# Patient Record
Sex: Female | Born: 1995 | Race: Black or African American | Hispanic: No | Marital: Single | Smoking: Never smoker
Health system: Southern US, Community
[De-identification: ages and names within clinical notes are randomized; demographics above are authoritative.]

## PROBLEM LIST (undated history)

## (undated) HISTORY — PX: WISDOM TOOTH EXTRACTION: SHX21

## (undated) HISTORY — PX: BREAST BIOPSY: SHX20

---
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
## 2016-01-04 DIAGNOSIS — R221 Localized swelling, mass and lump, neck: Secondary | ICD-10-CM | POA: Diagnosis not present

## 2016-01-05 DIAGNOSIS — K047 Periapical abscess without sinus: Secondary | ICD-10-CM | POA: Diagnosis not present

## 2016-03-05 DIAGNOSIS — J029 Acute pharyngitis, unspecified: Secondary | ICD-10-CM | POA: Diagnosis not present

## 2016-03-17 DIAGNOSIS — J029 Acute pharyngitis, unspecified: Secondary | ICD-10-CM | POA: Diagnosis not present

## 2016-04-09 DIAGNOSIS — J351 Hypertrophy of tonsils: Secondary | ICD-10-CM | POA: Diagnosis not present

## 2016-04-09 DIAGNOSIS — Z8709 Personal history of other diseases of the respiratory system: Secondary | ICD-10-CM | POA: Insufficient documentation

## 2016-04-09 DIAGNOSIS — J302 Other seasonal allergic rhinitis: Secondary | ICD-10-CM | POA: Insufficient documentation

## 2016-04-30 DIAGNOSIS — Z0000 Encounter for general adult medical examination without abnormal findings: Secondary | ICD-10-CM | POA: Diagnosis not present

## 2016-04-30 DIAGNOSIS — N938 Other specified abnormal uterine and vaginal bleeding: Secondary | ICD-10-CM | POA: Diagnosis not present

## 2016-04-30 DIAGNOSIS — Z23 Encounter for immunization: Secondary | ICD-10-CM | POA: Diagnosis not present

## 2016-06-19 DIAGNOSIS — H5203 Hypermetropia, bilateral: Secondary | ICD-10-CM | POA: Diagnosis not present

## 2016-07-20 DIAGNOSIS — J069 Acute upper respiratory infection, unspecified: Secondary | ICD-10-CM | POA: Diagnosis not present

## 2016-07-20 DIAGNOSIS — B9789 Other viral agents as the cause of diseases classified elsewhere: Secondary | ICD-10-CM | POA: Diagnosis not present

## 2016-09-17 DIAGNOSIS — R002 Palpitations: Secondary | ICD-10-CM | POA: Diagnosis not present

## 2016-09-17 DIAGNOSIS — J069 Acute upper respiratory infection, unspecified: Secondary | ICD-10-CM | POA: Diagnosis not present

## 2016-09-17 DIAGNOSIS — B9789 Other viral agents as the cause of diseases classified elsewhere: Secondary | ICD-10-CM | POA: Diagnosis not present

## 2016-10-01 ENCOUNTER — Other Ambulatory Visit: Payer: Self-pay | Admitting: Family Medicine

## 2016-10-01 DIAGNOSIS — R002 Palpitations: Secondary | ICD-10-CM

## 2016-10-01 DIAGNOSIS — F418 Other specified anxiety disorders: Secondary | ICD-10-CM | POA: Diagnosis not present

## 2016-10-01 DIAGNOSIS — Z6832 Body mass index (BMI) 32.0-32.9, adult: Secondary | ICD-10-CM | POA: Diagnosis not present

## 2016-10-11 ENCOUNTER — Ambulatory Visit (HOSPITAL_COMMUNITY): Payer: 59 | Attending: Cardiovascular Disease

## 2016-10-11 ENCOUNTER — Other Ambulatory Visit: Payer: Self-pay

## 2016-10-11 DIAGNOSIS — I340 Nonrheumatic mitral (valve) insufficiency: Secondary | ICD-10-CM | POA: Diagnosis not present

## 2016-10-11 DIAGNOSIS — R002 Palpitations: Secondary | ICD-10-CM | POA: Diagnosis not present

## 2016-10-16 ENCOUNTER — Encounter (INDEPENDENT_AMBULATORY_CARE_PROVIDER_SITE_OTHER): Payer: Self-pay

## 2016-10-16 ENCOUNTER — Ambulatory Visit (INDEPENDENT_AMBULATORY_CARE_PROVIDER_SITE_OTHER): Payer: 59

## 2016-10-16 DIAGNOSIS — R002 Palpitations: Secondary | ICD-10-CM | POA: Insufficient documentation

## 2016-10-17 ENCOUNTER — Other Ambulatory Visit (HOSPITAL_COMMUNITY): Payer: Self-pay

## 2016-11-30 DIAGNOSIS — J3089 Other allergic rhinitis: Secondary | ICD-10-CM | POA: Diagnosis not present

## 2016-11-30 DIAGNOSIS — R030 Elevated blood-pressure reading, without diagnosis of hypertension: Secondary | ICD-10-CM | POA: Diagnosis not present

## 2016-11-30 DIAGNOSIS — R002 Palpitations: Secondary | ICD-10-CM | POA: Diagnosis not present

## 2016-11-30 DIAGNOSIS — E669 Obesity, unspecified: Secondary | ICD-10-CM | POA: Diagnosis not present

## 2017-02-06 DIAGNOSIS — Z111 Encounter for screening for respiratory tuberculosis: Secondary | ICD-10-CM | POA: Diagnosis not present

## 2017-03-30 DIAGNOSIS — B338 Other specified viral diseases: Secondary | ICD-10-CM | POA: Diagnosis not present

## 2017-05-01 DIAGNOSIS — Z0000 Encounter for general adult medical examination without abnormal findings: Secondary | ICD-10-CM | POA: Diagnosis not present

## 2017-05-01 DIAGNOSIS — L309 Dermatitis, unspecified: Secondary | ICD-10-CM | POA: Diagnosis not present

## 2017-05-01 DIAGNOSIS — Z6833 Body mass index (BMI) 33.0-33.9, adult: Secondary | ICD-10-CM | POA: Diagnosis not present

## 2017-05-01 DIAGNOSIS — J3089 Other allergic rhinitis: Secondary | ICD-10-CM | POA: Diagnosis not present

## 2017-05-01 DIAGNOSIS — Z23 Encounter for immunization: Secondary | ICD-10-CM | POA: Diagnosis not present

## 2017-08-15 DIAGNOSIS — J0100 Acute maxillary sinusitis, unspecified: Secondary | ICD-10-CM | POA: Diagnosis not present

## 2017-08-20 ENCOUNTER — Emergency Department (HOSPITAL_COMMUNITY)
Admission: EM | Admit: 2017-08-20 | Discharge: 2017-08-20 | Disposition: A | Discharge status: Home / Self Care | Payer: 59 | Attending: Emergency Medicine | Admitting: Emergency Medicine

## 2017-08-20 ENCOUNTER — Encounter (HOSPITAL_COMMUNITY): Payer: Self-pay | Admitting: Emergency Medicine

## 2017-08-20 ENCOUNTER — Other Ambulatory Visit: Payer: Self-pay

## 2017-08-20 DIAGNOSIS — J3489 Other specified disorders of nose and nasal sinuses: Secondary | ICD-10-CM | POA: Diagnosis not present

## 2017-08-20 DIAGNOSIS — J039 Acute tonsillitis, unspecified: Secondary | ICD-10-CM | POA: Insufficient documentation

## 2017-08-20 DIAGNOSIS — R638 Other symptoms and signs concerning food and fluid intake: Secondary | ICD-10-CM | POA: Diagnosis not present

## 2017-08-20 DIAGNOSIS — J029 Acute pharyngitis, unspecified: Secondary | ICD-10-CM | POA: Diagnosis not present

## 2017-08-20 LAB — RAPID STREP SCREEN (MED CTR MEBANE ONLY): Streptococcus, Group A Screen (Direct): NEGATIVE

## 2017-08-20 LAB — POC URINE PREG, ED: Preg Test, Ur: NEGATIVE

## 2017-08-20 MED ORDER — PREDNISONE 10 MG PO TABS: 40.0000 mg | ORAL_TABLET | Freq: Every day | ORAL | 0 refills | Status: DC

## 2017-08-20 MED ORDER — METHYLPREDNISOLONE SODIUM SUCC 125 MG IJ SOLR
125.0000 mg | Freq: Once | INTRAMUSCULAR | Status: AC
  Administered 2017-08-20: 125 mg via INTRAVENOUS
  Filled 2017-08-20: qty 2

## 2017-08-20 MED ORDER — NAPROXEN 500 MG PO TABS: 500.0000 mg | ORAL_TABLET | Freq: Two times a day (BID) | ORAL | 0 refills | Status: DC

## 2017-08-20 MED ORDER — KETOROLAC TROMETHAMINE 30 MG/ML IJ SOLN
30.0000 mg | Freq: Once | INTRAMUSCULAR | Status: AC
Start: 2017-08-20 — End: 2017-08-20
  Administered 2017-08-20: 30 mg via INTRAVENOUS
  Filled 2017-08-20: qty 1

## 2017-08-20 MED ORDER — CLINDAMYCIN PHOSPHATE 600 MG/50ML IV SOLN
600.0000 mg | Freq: Once | INTRAVENOUS | Status: AC
  Administered 2017-08-20: 600 mg via INTRAVENOUS
  Filled 2017-08-20: qty 50

## 2017-08-20 MED ORDER — SODIUM CHLORIDE 0.9 % IV BOLUS (SEPSIS)
1000.0000 mL | Freq: Once | INTRAVENOUS | Status: AC
  Administered 2017-08-20: 1000 mL via INTRAVENOUS

## 2017-08-20 MED ORDER — CLINDAMYCIN HCL 150 MG PO CAPS: 450.0000 mg | ORAL_CAPSULE | Freq: Three times a day (TID) | ORAL | 0 refills | Status: AC

## 2017-08-20 NOTE — ED Provider Notes (Signed)
[facility]-EMERGENCY DEPT Provider Note   CSN: [id number] Arrival date & time: 08/20/17  4098     History   Chief Complaint Chief Complaint  Patient presents with  . Facial Pain  . Sore Throat    HPI Martha Pollard is a 22 y.o. female who presents emerged department today for sore throat.  Patient notes that on 08/08/2017 she started developing sinus pressure, pain and rhinorrhea.  She seek medical care with her primary care physician on 08/15/2017 where she was diagnosed with a sinus infection and placed on amoxicillin.  The patient has been taking this medication as prescribed.  She notes on 08/17/2017 she began having sore throat.  She looked in the back of her throat and is noted that in the subsequent days her tonsils have become more swollen with a development of dysphagia yesterday.  She has been able to tolerate fluids at home but notes decrease intake due to the pain of swallowing.  She has been taking OTC medications for her symptoms at home without relief. She denies fever, chills, inability to control secretions, N/V, cough, neck stiffness, ear fullness/drainage, itchy/watery eyes, voice change, dental disease, oral sex, or trauma.   HPI  History reviewed. No pertinent past medical history.  Patient Active Problem List   Diagnosis Date Noted  . Palpitations 10/16/2016    Past Surgical History:  Procedure Laterality Date  . BREAST BIOPSY     right  . WISDOM TOOTH EXTRACTION      OB History    No data available       Home Medications    Prior to Admission medications   Medication Sig Start Date End Date Taking? Authorizing Provider  amoxicillin (AMOXIL) 500 MG capsule Take 500 mg by mouth 2 (two) times daily.   Yes [provider]    Family History History reviewed. No pertinent family history.  Social History Social History   Tobacco Use  . Smoking status: Never Smoker  Substance Use Topics  . Alcohol use: No   Frequency: Never  . Drug use: No     Allergies   Patient has no known allergies.   Review of Systems Review of Systems  All other systems reviewed and are negative.    Physical Exam Updated Vital Signs BP (!) 146/83   Pulse (!) 109   Temp 99.4 F (37.4 C) (Oral)   Resp 20   Wt 81.6 kg (180 lb)   LMP 08/11/2017 (Exact Date) Comment: pt stated that she is not sexually active  SpO2 98%   Physical Exam  Constitutional: She appears well-developed and well-nourished.  HENT:  Head: Normocephalic and atraumatic.  Right Ear: Tympanic membrane and external ear normal.  Left Ear: Tympanic membrane and external ear normal.  Nose: Mucosal edema and rhinorrhea present. Right sinus exhibits maxillary sinus tenderness. Right sinus exhibits no frontal sinus tenderness. Left sinus exhibits maxillary sinus tenderness. Left sinus exhibits no frontal sinus tenderness.  The patient has normal/nasaly phonation and is in control of secretions. No stridor.  Midline uvula without edema. Soft palate rises symmetrically. There is symmetric kissing tonsils bilaterally with erythema and exudates. Close examination without peritonsillar swelling or evidence of PTA. Tongue protrusion is normal. No trismus. No creptius on neck palpation and patient has good dentition. No gingival erythema or fluctuance noted. No floor edema. Mucus membranes slightly dry.   Eyes: Pupils are equal, round, and reactive to light. Right eye exhibits no discharge. Left eye exhibits no discharge.  No scleral icterus.  Neck: Trachea normal. Neck supple. No spinous process tenderness present. No neck rigidity. Normal range of motion present.  No nuchal rigidity or meningismus  Cardiovascular: Normal rate, regular rhythm and intact distal pulses.  No murmur heard. Pulses:      Radial pulses are 2+ on the right side, and 2+ on the left side.       Dorsalis pedis pulses are 2+ on the right side, and 2+ on the left side.       Posterior  tibial pulses are 2+ on the right side, and 2+ on the left side.  No lower extremity swelling or edema. Calves symmetric in size bilaterally.  Pulmonary/Chest: Effort normal and breath sounds normal. She exhibits no tenderness.  No increased work of breathing. No accessory muscle use. Patient is sitting upright, speaking in full sentences without difficulty   Abdominal: Soft. Bowel sounds are normal. There is no tenderness. There is no rebound and no guarding.  Musculoskeletal: She exhibits no edema.  Lymphadenopathy:    She has no cervical adenopathy.  Neurological: She is alert.  Skin: Skin is warm and dry. No rash noted. She is not diaphoretic.  Psychiatric: She has a normal mood and affect.  Nursing note and vitals reviewed.    ED Treatments / Results  Labs (all labs ordered are listed, but only abnormal results are displayed) Labs Reviewed  RAPID STREP SCREEN (NOT AT [facility])  CULTURE, GROUP A STREP ([facility])  POC URINE PREG, ED    EKG  EKG Interpretation None       Radiology No results found.  Procedures Procedures (including critical care time)  Medications Ordered in ED Medications  clindamycin (CLEOCIN) IVPB 600 mg (not administered)  sodium chloride 0.9 % bolus 1,000 mL (not administered)  ketorolac (TORADOL) 30 MG/ML injection 30 mg (not administered)  methylPREDNISolone sodium succinate (SOLU-MEDROL) 125 mg/2 mL injection 125 mg (not administered)     Initial Impression / Assessment and Plan / ED Course  I have reviewed the triage vital signs and the nursing notes.  Pertinent labs & imaging results that were available during my care of the patient were reviewed by me and considered in my medical decision making (see chart for details).     22 year old female presenting to the emergency department today for increasing sore throat over the last 3 days with associated dysphasia and decreased oral intake.  Patient was diagnosed with sinus infection on 2/7 and  prescribed amoxicillin that she has been taking as prescribed (5 days).  On presentation the patient is noted to have tachycardia likely due to decreased oral intake as well as dry mucous membranes.  No fever, hypoxia, tachypnea or hypotension.The patient has normal phonation and is in control of secretions  which makes me believe this is not epiglottitis. No stridor to indicate FB. No trismus. Midline uvula with no peritonsillar swelling. Do not suspect PTA at this time.  No creptius on neck palpation and patient has good dentition which makes ludwigs unlikely. Denies oral sex, do not suspect from G/C. Strep test negative however patient does have significant tonsillar swelling that is symmetric bilaterally (kissing tonsills) with erythema and exudate. Suspect bacterial tonsillitis. The patient appears midlly dehydrated with dry MM and tachycardia. Will give IVF, steroids and change antibotic to clindamycin.  After IV fluids patient's vital signs are improving.  She was able to tolerate greater than 8 ounces of fluid in the department.  She states pain has decreased and is  tolerable.  Will discharge home on steroid burst, clindamycin and NSAIDs for pain. [facility] who I will have her follow up with if symptoms do not improve vs PCP.  Discussion about warning signs for return. Specific return precautions discussed. Time was given for all questions to be answered. The patient verbalized understanding and agreement with plan. The patient appears safe for discharge home.    Final Clinical Impressions(s) / ED Diagnoses   Final diagnoses:  Tonsillitis    ED Discharge Orders        Ordered    clindamycin (CLEOCIN) 150 MG capsule  3 times daily     08/20/17 1450    naproxen (NAPROSYN) 500 MG tablet  2 times daily     08/20/17 1450    predniSONE (DELTASONE) 10 MG tablet  Daily with breakfast     08/20/17 1450       Princella PellegriniMaczis, Authur Cubit M, PA-C 08/20/17 1451    Jacalyn LefevreHaviland, Julie,  MD 08/20/17 1512

## 2017-08-20 NOTE — ED Triage Notes (Addendum)
Pt reports 7 day hx of increasing throat pain,tonsil swelling and sinus pressure.Martha Pollard.Posterior pharynx is red and swollen with multiple white pustules.  Seen by PCP 6 days ago and was started on Amoxicillin. Pt advised to be seen in ED if sx increased. Stated that she has had difficulty swallowing since yesterday. Pt is alert, oriented. Family at bedside.

## 2017-08-20 NOTE — Discharge Instructions (Signed)
Your seen here today for sore throat.  Your strep test was negative however I believe you have bacterial tonsillitis.  I am switching her antibiotic from amoxicillin to clindamycin.  You can discontinue taking the amoxicillin. Please take all of your clindamycin until finished!   You may develop abdominal discomfort or diarrhea from the antibiotic.  You may help offset this with probiotics which you can buy or get in yogurt. Do not eat or take the probiotics until 2 hours after your antibiotic. Do not take your medicine if develop an itchy rash, swelling in your mouth or lips, or difficulty breathing.  I am also prescribing you a steroid burst to help you with the tonsillar swelling.  Take Naproxen otherwise as needed for pain. Please follow up with PCP in the next 2 days for re-evaluation to see if your symptoms are improving. For continued symptoms please follow up with ENT.   Get help right away if: You have any new symptoms such as: Vomiting Very bad headache Stiff neck Chest pain Trouble breathing or swallowing You have very bad throat pain and also have drooling or voice changes. You have very bad pain that is not helped by medicine. You cannot fully open your mouth. You have redness, swelling, or severe pain anywhere in your neck.

## 2017-08-22 DIAGNOSIS — J039 Acute tonsillitis, unspecified: Secondary | ICD-10-CM | POA: Diagnosis not present

## 2017-08-22 LAB — CULTURE, GROUP A STREP (THRC)

## 2017-08-27 DIAGNOSIS — H5213 Myopia, bilateral: Secondary | ICD-10-CM | POA: Diagnosis not present

## 2017-09-20 DIAGNOSIS — J029 Acute pharyngitis, unspecified: Secondary | ICD-10-CM | POA: Diagnosis not present

## 2017-09-20 DIAGNOSIS — J020 Streptococcal pharyngitis: Secondary | ICD-10-CM | POA: Diagnosis not present

## 2017-11-11 DIAGNOSIS — J302 Other seasonal allergic rhinitis: Secondary | ICD-10-CM | POA: Diagnosis not present

## 2017-11-11 DIAGNOSIS — J351 Hypertrophy of tonsils: Secondary | ICD-10-CM | POA: Diagnosis not present

## 2017-11-11 DIAGNOSIS — J0391 Acute recurrent tonsillitis, unspecified: Secondary | ICD-10-CM | POA: Diagnosis not present

## 2018-01-28 DIAGNOSIS — J029 Acute pharyngitis, unspecified: Secondary | ICD-10-CM | POA: Diagnosis not present

## 2018-01-28 DIAGNOSIS — J039 Acute tonsillitis, unspecified: Secondary | ICD-10-CM | POA: Diagnosis not present

## 2018-02-12 DIAGNOSIS — Z111 Encounter for screening for respiratory tuberculosis: Secondary | ICD-10-CM | POA: Diagnosis not present

## 2018-02-28 DIAGNOSIS — R350 Frequency of micturition: Secondary | ICD-10-CM | POA: Diagnosis not present

## 2018-02-28 DIAGNOSIS — R300 Dysuria: Secondary | ICD-10-CM | POA: Diagnosis not present

## 2018-02-28 DIAGNOSIS — N390 Urinary tract infection, site not specified: Secondary | ICD-10-CM | POA: Diagnosis not present

## 2018-04-01 ENCOUNTER — Telehealth: Payer: 59 | Admitting: Nurse Practitioner

## 2018-04-01 DIAGNOSIS — B3731 Acute candidiasis of vulva and vagina: Secondary | ICD-10-CM

## 2018-04-01 DIAGNOSIS — B373 Candidiasis of vulva and vagina: Secondary | ICD-10-CM | POA: Diagnosis not present

## 2018-04-01 MED ORDER — FLUCONAZOLE 150 MG PO TABS: 150.0000 mg | ORAL_TABLET | Freq: Once | ORAL | 0 refills | Status: AC

## 2018-04-01 NOTE — Progress Notes (Signed)

## 2018-04-03 ENCOUNTER — Telehealth: Payer: 59 | Admitting: Family

## 2018-04-03 DIAGNOSIS — B373 Candidiasis of vulva and vagina: Secondary | ICD-10-CM | POA: Diagnosis not present

## 2018-04-03 DIAGNOSIS — H9203 Otalgia, bilateral: Secondary | ICD-10-CM

## 2018-04-03 DIAGNOSIS — J020 Streptococcal pharyngitis: Secondary | ICD-10-CM | POA: Diagnosis not present

## 2018-04-03 DIAGNOSIS — B3731 Acute candidiasis of vulva and vagina: Secondary | ICD-10-CM

## 2018-04-03 MED ORDER — PREDNISONE 5 MG PO TABS: 5.0000 mg | ORAL_TABLET | ORAL | 0 refills | Status: DC

## 2018-04-03 MED ORDER — AMOXICILLIN-POT CLAVULANATE 875-125 MG PO TABS: 1.0000 | ORAL_TABLET | Freq: Two times a day (BID) | ORAL | 0 refills | Status: AC

## 2018-04-03 MED ORDER — FLUCONAZOLE 150 MG PO TABS: 150.0000 mg | ORAL_TABLET | Freq: Once | ORAL | 0 refills | Status: AC

## 2018-04-03 NOTE — Progress Notes (Signed)
Thank you for the details you included in the comment boxes. Those details are very helpful in determining the best course of treatment for you and help [place] to provide the best care. In this particular situation, the photo you uploaded could be a severe bacterial tonsillitis yet could also be strep. Strep varies a lot in presentation from person to person. I am also concerned about your ear pain and that this has all been going on for 7 days. I saw your request not to receive more antibiotics. However, we must use best practices which would be to give the proper dose of antibiotics (the dose you have is actually too low). You asked about the swelling. The proper dose of antibiotics will help that as will a prednisone pack. Additionally, the prednisone pack will help with your ear pain.   Your situation is unique as you have a few things going on. In medicine, we are often guilty of searching for one problem when there are actually 2 or 3 causing symptoms. This appears to be the case in your situation today. That all being said, I am increasing your antibiotics yet also adding more diflucan to prevent a yeast infection. Unfortunately, this is one of the necessary evils for some people who get yeast infections easily. We have to treat this infection as a priority and can try to prevent and treat a yeast infection also.   We are sorry that you are not feeling well.  Here is how we plan to help!  Based on what you have shared with me it is likely that you have sinusitis and also possibly strep pharyngitis.  Strep pharyngitis is inflammation and infection in the back of the throat.  This is an infection cause by bacteria and is treated with antibiotics.  I have prescribed Augmentin 875mg /125mg  one tablet twice daily with food, for 7 days.. I have also sent a steroid pack taper of tablets days 1-6, taking 6,5,4,3,2,1. It will be on the bottle.   For throat pain, we recommend over the counter oral pain relief  medications such as acetaminophen or aspirin, or anti-inflammatory medications such as ibuprofen or naproxen sodium. Topical treatments such as oral throat lozenges or sprays may be used as needed. Strep infections are not as easily transmitted as other respiratory infections, however we still recommend that you avoid close contact with loved ones, especially the very young and elderly.  Remember to wash your hands thoroughly throughout the day as this is the number one way to prevent the spread of infection and wipe down door knobs and counters with disinfectant.   Home Care:  Only take medications as instructed by your medical team.  Complete the entire course of an antibiotic.  Do not take these medications with alcohol.  A steam or ultrasonic humidifier can help congestion.  You can place a towel over your head and breathe in the steam from hot water coming from a faucet.  Avoid close contacts especially the very young and the elderly.  Cover your mouth when you cough or sneeze.  Always remember to wash your hands.  Get Help Right Away If:  You develop worsening fever or sinus pain.  You develop a severe head ache or visual changes.  Your symptoms persist after you have completed your treatment plan.  Make sure you  Understand these instructions.  Will watch your condition.  Will get help right away if you are not doing well or get worse.  Your e-visit answers  were reviewed by a board certified advanced clinical practitioner to complete your personal care plan.  Depending on the condition, your plan could have included both over the counter or prescription medications.  If there is a problem please reply  once you have received a response from your provider.  Your safety is important to [place].  If you have drug allergies check your prescription carefully.    You can use MyChart to ask questions about today's visit, request a non-urgent call back, or ask for a work or school  excuse for 24 hours related to this e-Visit. If it has been greater than 24 hours you will need to follow up with your provider, or enter a new e-Visit to address those concerns.  You will get an e-mail in the next two days asking about your experience.  I hope that your e-visit has been valuable and will speed your recovery. Thank you for using e-visits.

## 2018-04-16 DIAGNOSIS — J0391 Acute recurrent tonsillitis, unspecified: Secondary | ICD-10-CM | POA: Diagnosis not present

## 2018-04-16 DIAGNOSIS — J3089 Other allergic rhinitis: Secondary | ICD-10-CM | POA: Diagnosis not present

## 2018-04-16 DIAGNOSIS — Z23 Encounter for immunization: Secondary | ICD-10-CM | POA: Diagnosis not present

## 2018-04-16 DIAGNOSIS — J309 Allergic rhinitis, unspecified: Secondary | ICD-10-CM | POA: Diagnosis not present

## 2018-04-16 DIAGNOSIS — J301 Allergic rhinitis due to pollen: Secondary | ICD-10-CM | POA: Diagnosis not present

## 2018-06-04 DIAGNOSIS — T7840XA Allergy, unspecified, initial encounter: Secondary | ICD-10-CM | POA: Diagnosis not present

## 2018-06-04 DIAGNOSIS — J0100 Acute maxillary sinusitis, unspecified: Secondary | ICD-10-CM | POA: Diagnosis not present

## 2018-06-17 ENCOUNTER — Telehealth: Payer: 59 | Admitting: Family Medicine

## 2018-06-17 DIAGNOSIS — N390 Urinary tract infection, site not specified: Secondary | ICD-10-CM

## 2018-06-17 MED ORDER — NITROFURANTOIN MONOHYD MACRO 100 MG PO CAPS: 100.0000 mg | ORAL_CAPSULE | Freq: Two times a day (BID) | ORAL | 0 refills | Status: AC

## 2018-06-17 MED FILL — NITROFURANTOIN MONO-MCR 100: 100 | 5 days supply | Qty: 10 | Fill #0

## 2018-06-17 NOTE — Progress Notes (Signed)

## 2018-07-03 ENCOUNTER — Telehealth: Payer: 59 | Admitting: Physician Assistant

## 2018-07-03 DIAGNOSIS — N390 Urinary tract infection, site not specified: Secondary | ICD-10-CM

## 2018-07-03 NOTE — Progress Notes (Signed)
Based on what you shared with me it looks like you have a condition that should be evaluated in a face to face office visit. Giving the recurrence of urinary symptoms so soon after antibiotic treatment, you really need to have urine tested. This can confirm infection but also let us know which bacteria is causing the infection and what antibiotics it may be resistant to. This will help you get the appropriate antibiotic treatment so that this does not keep recurring!   NOTE: If you entered your credit card information for this eVisit, you will not be charged. You may see a "hold" on your card for the $30 but that hold will drop off and you will not have a charge processed.  If you are having a true medical emergency please call 911.  If you need an urgent face to face visit, [place] has four urgent care centers for your convenience.  If you need care fast and have a high deductible or no insurance consider:   [email or web address] to reserve your spot online an avoid wait times  [facility] [address], [address] [place], [place] [address] 8 am to 8 pm Monday-Friday 10 am to 4 pm Saturday-Sunday *Across the street from [employer]  [facility]  [address] [place], [id number] 8 am to 5 pm Monday-Friday * In the [facility] on the [facility]   The following sites will take your  insurance:  . [facility]  [phone] Get Driving Directions Find a Provider at this Location  [address] [place], [place] [address] . 10 am to 8 pm Monday-Friday . 12 pm to 8 pm Saturday-Sunday   . [facility]  [phone] Get Driving Directions Find a Provider at this Location  [address], [address] [place], [place] [address] . 8 am to 8 pm Monday-Friday . 9 am to 6 pm Saturday . 11 am to 6 pm Sunday   . [facility]  [phone] Get  Driving Directions  [address] [place], [place] [address] . 8 am to 8 pm Monday-Friday . 8 am to 4 pm Saturday-Sunday   Your e-visit answers were reviewed by a board certified advanced clinical practitioner to complete your personal care plan.  Thank you for using e-Visits.

## 2018-07-05 ENCOUNTER — Telehealth: Payer: 59 | Admitting: Nurse Practitioner

## 2018-07-05 DIAGNOSIS — N3000 Acute cystitis without hematuria: Secondary | ICD-10-CM | POA: Diagnosis not present

## 2018-07-05 NOTE — Progress Notes (Signed)
Based on what you shared with me it looks like you have a serious condition that should be evaluated in a face to face office visit.  You just did evisit on 12/10 for UTI which would make this recurrent and you need to see PCP so that you can get proper treatment and a culture done.     NOTE: If you entered your credit card information for this eVisit, you will not be charged. You may see a "hold" on your card for the $30 but that hold will drop off and you will not have a charge processed.  If you are having a true medical emergency please call 911.  If you need an urgent face to face visit, [place] has four urgent care centers for your convenience.  If you need care fast and have a high deductible or no insurance consider:   [email or web address] to reserve your spot online an avoid wait times  [facility] [address], [address] [place], [place] [address] 8 am to 8 pm Monday-Friday 10 am to 4 pm Saturday-Sunday *Across the street from [employer]  [facility]  [address] [place], [id number] 8 am to 5 pm Monday-Friday * In the [facility] on the [facility]   The following sites will take your  insurance:  . [facility]  [phone] Get Driving Directions Find a Provider at this Location  [address] [place], [place] [address] . 10 am to 8 pm Monday-Friday . 12 pm to 8 pm Saturday-Sunday   . [facility] at [facility]  [phone] Get Driving Directions Find a Provider at this Location  [address], [address] [place], [place] [address] . 8 am to 8 pm Monday-Friday . 9 am to 6 pm Saturday . 11 am to 6 pm Sunday   . [facility] at [facility]  [phone] Get Driving Directions  [address] [place], [place] [address] . 8 am to 8 pm Monday-Friday . 8 am to 4 pm Saturday-Sunday   Your e-visit answers were reviewed by a board  certified advanced clinical practitioner to complete your personal care plan.  Thank you for using e-Visits.

## 2018-08-04 DIAGNOSIS — Z113 Encounter for screening for infections with a predominantly sexual mode of transmission: Secondary | ICD-10-CM | POA: Diagnosis not present

## 2018-08-04 DIAGNOSIS — R350 Frequency of micturition: Secondary | ICD-10-CM | POA: Diagnosis not present

## 2018-08-04 DIAGNOSIS — Z114 Encounter for screening for human immunodeficiency virus [HIV]: Secondary | ICD-10-CM | POA: Diagnosis not present

## 2018-08-26 MED FILL — TACROLIMUS 0.03% OINTMENT: 0.03 | 15 days supply | Qty: 30 | Fill #0

## 2018-09-22 ENCOUNTER — Other Ambulatory Visit: Payer: Self-pay | Admitting: Family Medicine

## 2018-09-22 ENCOUNTER — Other Ambulatory Visit (HOSPITAL_COMMUNITY)
Admission: RE | Admit: 2018-09-22 | Discharge: 2018-09-22 | Disposition: A | Discharge status: Home / Self Care | Payer: 59 | Source: Ambulatory Visit | Attending: Family Medicine | Admitting: Family Medicine

## 2018-09-22 DIAGNOSIS — Z124 Encounter for screening for malignant neoplasm of cervix: Secondary | ICD-10-CM | POA: Insufficient documentation

## 2018-09-22 DIAGNOSIS — Z0000 Encounter for general adult medical examination without abnormal findings: Secondary | ICD-10-CM | POA: Diagnosis not present

## 2018-09-22 DIAGNOSIS — Z309 Encounter for contraceptive management, unspecified: Secondary | ICD-10-CM | POA: Diagnosis not present

## 2018-09-22 DIAGNOSIS — Z202 Contact with and (suspected) exposure to infections with a predominantly sexual mode of transmission: Secondary | ICD-10-CM | POA: Diagnosis not present

## 2018-09-22 DIAGNOSIS — Z13220 Encounter for screening for lipoid disorders: Secondary | ICD-10-CM | POA: Diagnosis not present

## 2018-09-23 LAB — CYTOLOGY - PAP
Chlamydia: NEGATIVE
Diagnosis: NEGATIVE
Neisseria Gonorrhea: NEGATIVE
Trichomonas: NEGATIVE

## 2018-09-26 DIAGNOSIS — H5203 Hypermetropia, bilateral: Secondary | ICD-10-CM | POA: Diagnosis not present

## 2019-04-13 DIAGNOSIS — Z111 Encounter for screening for respiratory tuberculosis: Secondary | ICD-10-CM | POA: Diagnosis not present

## 2019-04-15 DIAGNOSIS — Z23 Encounter for immunization: Secondary | ICD-10-CM | POA: Diagnosis not present

## 2019-04-15 DIAGNOSIS — Z111 Encounter for screening for respiratory tuberculosis: Secondary | ICD-10-CM | POA: Diagnosis not present

## 2019-05-25 ENCOUNTER — Telehealth: Payer: 59 | Admitting: Physician Assistant

## 2019-05-25 DIAGNOSIS — N760 Acute vaginitis: Secondary | ICD-10-CM

## 2019-05-25 MED ORDER — NYSTATIN 100000 UNIT/GM EX CREA: 1.0000 "application " | TOPICAL_CREAM | Freq: Two times a day (BID) | CUTANEOUS | 0 refills | Status: DC

## 2019-05-25 MED ORDER — FLUCONAZOLE 150 MG PO TABS: 150.0000 mg | ORAL_TABLET | Freq: Once | ORAL | 0 refills | Status: AC

## 2019-05-25 MED FILL — FLUTICASONE PROP 50 MCG SPR: 50 | 30 days supply | Qty: 16 | Fill #0

## 2019-05-25 MED FILL — FLUCONAZOLE 150 MG TABLET: 150 | 1 days supply | Qty: 1 | Fill #0

## 2019-05-25 MED FILL — NYSTATIN 100,000 UNIT/GM CR: 100000 | 30 days supply | Qty: 30 | Fill #0

## 2019-05-25 NOTE — Progress Notes (Signed)
We are sorry that you are not feeling well. Here is how we plan to help! Based on what you shared with me it looks like you: May have a yeast vaginosis  Vaginosis is an inflammation of the vagina that can result in discharge, itching and pain. The cause is usually a change in the normal balance of vaginal bacteria or an infection. Vaginosis can also result from reduced estrogen levels after menopause.  The most common causes of vaginosis are:   Bacterial vaginosis which results from an overgrowth of one on several organisms that are normally present in your vagina.   Yeast infections which are caused by a naturally occurring fungus called candida.   Vaginal atrophy (atrophic vaginosis) which results from the thinning of the vagina from reduced estrogen levels after menopause.   Trichomoniasis which is caused by a parasite and is commonly transmitted by sexual intercourse.  Factors that increase your risk of developing vaginosis include: Marland Kitchen Medications, such as antibiotics and steroids . Uncontrolled diabetes . Use of hygiene products such as bubble bath, vaginal spray or vaginal deodorant . Douching . Wearing damp or tight-fitting clothing . Using an intrauterine device (IUD) for birth control . Hormonal changes, such as those associated with pregnancy, birth control pills or menopause . Sexual activity . Having a sexually transmitted infection  Your treatment plan is A single Diflucan (fluconazole) 150mg  tablet once.  I have electronically sent this prescription into the pharmacy that you have chosen. I have also sent in a prescription for nystatin cream. Use as directed.  These medications should improve your symptoms within the next 24-48 hours. If they do not improve your symptoms then you will need to follow up in person with either your regular doctor or at urgent care.   Be sure to take all of the medication as directed. Stop taking any medication if you develop a rash, tongue  swelling or shortness of breath. Mothers who are breast feeding should consider pumping and discarding their breast milk while on these antibiotics. However, there is no consensus that infant exposure at these doses would be harmful.  Remember that medication creams can weaken latex condoms.   HOME CARE:  Good hygiene may prevent some types of vaginosis from recurring and may relieve some symptoms:  . Avoid baths, hot tubs and whirlpool spas. Rinse soap from your outer genital area after a shower, and dry the area well to prevent irritation. Don't use scented or harsh soaps, such as those with deodorant or antibacterial action. Marland Kitchen Avoid irritants. These include scented tampons and pads. . Wipe from front to back after using the toilet. Doing so avoids spreading fecal bacteria to your vagina.  Other things that may help prevent vaginosis include:  Marland Kitchen Don't douche. Your vagina doesn't require cleansing other than normal bathing. Repetitive douching disrupts the normal organisms that reside in the vagina and can actually increase your risk of vaginal infection. Douching won't clear up a vaginal infection. . Use a latex condom. Both female and female latex condoms may help you avoid infections spread by sexual contact. . Wear cotton underwear. Also wear pantyhose with a cotton crotch. If you feel comfortable without it, skip wearing underwear to bed. Yeast thrives in Marland Kitchen Your symptoms should improve in the next day or two.  GET HELP RIGHT AWAY IF:  . You have pain in your lower abdomen ( pelvic area or over your ovaries) . You develop nausea or vomiting . You develop a fever .  Your discharge changes or worsens . You have persistent pain with intercourse . You develop shortness of breath, a rapid pulse, or you faint.  These symptoms could be signs of problems or infections that need to be evaluated by a medical provider now.  MAKE SURE YOU    Understand these  instructions.  Will watch your condition.  Will get help right away if you are not doing well or get worse.  Your e-visit answers were reviewed by a board certified advanced clinical practitioner to complete your personal care plan. Depending upon the condition, your plan could have included both over the counter or prescription medications. Please review your pharmacy choice to make sure that you have choses a pharmacy that is open for you to pick up any needed prescription, Your safety is important to [place]. If you have drug allergies check your prescription carefully.   You can use MyChart to ask questions about today's visit, request a non-urgent call back, or ask for a work or school excuse for 24 hours related to this e-Visit. If it has been greater than 24 hours you will need to follow up with your provider, or enter a new e-Visit to address those concerns. You will get a MyChart message within the next two days asking about your experience. I hope that your e-visit has been valuable and will speed your recovery.  Approximately 5 minutes was spent documenting and reviewing patient's chart.

## 2019-06-16 MED FILL — SRONYX 0.1-20 MG-MCG TABS: 0.1-20 | 28 days supply | Qty: 28 | Fill #0

## 2019-06-27 ENCOUNTER — Telehealth: Payer: 59 | Admitting: Physician Assistant

## 2019-06-27 DIAGNOSIS — M545 Low back pain, unspecified: Secondary | ICD-10-CM

## 2019-06-27 MED ORDER — NAPROXEN 500 MG PO TABS: 500.0000 mg | ORAL_TABLET | Freq: Two times a day (BID) | ORAL | 0 refills | Status: DC

## 2019-06-27 MED ORDER — CYCLOBENZAPRINE HCL 10 MG PO TABS: 10.0000 mg | ORAL_TABLET | Freq: Three times a day (TID) | ORAL | 0 refills | Status: DC | PRN

## 2019-06-27 NOTE — Progress Notes (Signed)

## 2019-06-27 NOTE — Progress Notes (Signed)
I have spent 5 minutes in review of e-visit questionnaire, review and updating patient chart, medical decision making and response to patient.   Adael Culbreath Cody Irvine Glorioso, PA-C    

## 2019-09-05 ENCOUNTER — Ambulatory Visit: Payer: Medicaid Other | Attending: Internal Medicine

## 2019-09-05 DIAGNOSIS — Z23 Encounter for immunization: Secondary | ICD-10-CM | POA: Insufficient documentation

## 2019-09-05 NOTE — Progress Notes (Signed)
   Covid-19 Vaccination Clinic  Name:  Martha Pollard    MRN: [id number] DOB: [birth year]  09/05/2019  [name] was observed post Covid-19 immunization for 15 minutes without incidence. She was provided with Vaccine Information Sheet and instruction to access the V-Safe system.   [name] was instructed to call 911 with any severe reactions post vaccine: Marland Kitchen Difficulty breathing  . Swelling of your face and throat  . A fast heartbeat  . A bad rash all over your body  . Dizziness and weakness    Immunizations Administered    Name Date Dose VIS Date Route   Moderna COVID-19 Vaccine 09/05/2019  1:44 PM 0.5 mL 06/09/2019 Intramuscular   Manufacturer: Moderna   Lot: [id number]   NDC: 50093-818-29

## 2019-10-03 ENCOUNTER — Ambulatory Visit: Payer: Medicaid Other

## 2019-10-07 ENCOUNTER — Ambulatory Visit: Payer: Medicaid Other

## 2019-10-08 ENCOUNTER — Ambulatory Visit: Payer: Medicaid Other | Attending: Family

## 2019-10-08 DIAGNOSIS — Z23 Encounter for immunization: Secondary | ICD-10-CM

## 2019-10-08 NOTE — Progress Notes (Signed)
   Covid-19 Vaccination Clinic  Name:  Martha Pollard    MRN: [id number] DOB: 26-Mar-1996  10/08/2019  [name] was observed post Covid-19 immunization for 15 minutes without incident. She was provided with Vaccine Information Sheet and instruction to access the V-Safe system.   [name] was instructed to call 911 with any severe reactions post vaccine: Marland Kitchen Difficulty breathing  . Swelling of face and throat  . A fast heartbeat  . A bad rash all over body  . Dizziness and weakness   Immunizations Administered    Name Date Dose VIS Date Route   Moderna COVID-19 Vaccine 10/08/2019 12:49 PM 0.5 mL 06/09/2019 Intramuscular   Manufacturer: Moderna   Lot: [id number]   NDC: 48185-909-31

## 2019-12-01 ENCOUNTER — Telehealth: Payer: Medicaid Other | Admitting: Physician Assistant

## 2019-12-01 DIAGNOSIS — R059 Cough, unspecified: Secondary | ICD-10-CM

## 2019-12-01 DIAGNOSIS — J019 Acute sinusitis, unspecified: Secondary | ICD-10-CM

## 2019-12-01 DIAGNOSIS — R05 Cough: Secondary | ICD-10-CM

## 2019-12-01 MED ORDER — PREDNISONE 20 MG PO TABS: ORAL_TABLET | ORAL | 0 refills | Status: DC

## 2019-12-01 MED ORDER — AZELASTINE HCL 0.1 % NA SOLN: 2.0000 | Freq: Two times a day (BID) | NASAL | 0 refills | Status: DC

## 2019-12-01 NOTE — Progress Notes (Signed)
E visit for Allergic Rhinitis We are sorry that you are not feeling well.  Here is how we plan to help!  Based on what you have shared with me it looks like you have Allergic Rhinitis.  Rhinitis is when a reaction occurs that causes nasal congestion, cough, runny nose, sneezing, and itching.  Most types of rhinitis are caused by an inflammation and are associated with symptoms in the eyes, ears or throat.  There are several types of rhinitis.  The most common are acute rhinitis, which is usually caused by a viral illness, allergic or seasonal rhinitis, and nonallergic or year-round rhinitis.  Nasal allergies occur certain times of the year.  Allergic rhinitis is caused when allergens in the air trigger the release of histamine in the body.  Histamine causes itching, swelling, and fluid to build up in the fragile linings of the nasal passages, sinuses and eyelids.  An itchy nose and clear discharge are common.  I see you have been tested for allergies in the past.  Please be sure to follow-up with your PCP soon to discuss gaining better control of your symptoms.   I have prescribed: Prednisone. Take this as directed. Take with food.    I have also prescribed Azelastine 1 or 2 sprays per nostril once or twice daily for your nasal congestion.   I recommend the following over the counter treatments: Xyzal 5 mg take 1 tablet daily  I also would recommend a nasal spray: Saline 1 spray into each nostril as needed  You may also benefit from eye drops such as: Systane 1-2 driops each eye twice daily as needed  HOME CARE:   You can use an over-the-counter saline nasal spray as needed  Avoid areas where there is heavy dust, mites, or molds  Stay indoors on windy days during the pollen season  Keep windows closed in home, at least in bedroom; use air conditioner.  Use high-efficiency house air filter  Keep windows closed in car, turn AC on re-circulate  Avoid playing out with dog during  pollen season  GET HELP RIGHT AWAY IF:   If your symptoms do not improve within 10 days  You become short of breath  You develop yellow or green discharge from your nose for over 3 days  You have coughing fits  MAKE SURE YOU:   Understand these instructions  Will watch your condition  Will get help right away if you are not doing well or get worse  Thank you for choosing an e-visit. Your e-visit answers were reviewed by a board certified advanced clinical practitioner to complete your personal care plan. Depending upon the condition, your plan could have included both over the counter or prescription medications. Please review your pharmacy choice. Be sure that the pharmacy you have chosen is open so that you can pick up your prescription now.  If there is a problem you may message your provider in [place] to have the prescription routed to another pharmacy. Your safety is important to [place]. If you have drug allergies check your prescription carefully.  For the next 24 hours, you can use MyChart to ask questions about today's visit, request a non-urgent call back, or ask for a work or school excuse from your e-visit provider. You will get an email in the next two days asking about your experience. I hope that your e-visit has been valuable and will speed your recovery.        Greater than 5 minutes, yet  less than 10 minutes of time have been spent researching, coordinating and implementing care for this patient today.

## 2020-01-02 ENCOUNTER — Telehealth: Payer: Medicaid Other | Admitting: Nurse Practitioner

## 2020-01-02 DIAGNOSIS — N390 Urinary tract infection, site not specified: Secondary | ICD-10-CM

## 2020-01-02 MED ORDER — NITROFURANTOIN MONOHYD MACRO 100 MG PO CAPS: 100.0000 mg | ORAL_CAPSULE | Freq: Two times a day (BID) | ORAL | 0 refills | Status: DC

## 2020-01-02 NOTE — Progress Notes (Signed)

## 2020-01-29 ENCOUNTER — Telehealth: Payer: Medicaid Other | Admitting: Emergency Medicine

## 2020-01-29 DIAGNOSIS — T7840XA Allergy, unspecified, initial encounter: Secondary | ICD-10-CM

## 2020-01-29 MED ORDER — PREDNISONE 20 MG PO TABS: 20.0000 mg | ORAL_TABLET | Freq: Every day | ORAL | 0 refills | Status: AC

## 2020-01-29 NOTE — Progress Notes (Signed)
E visit for Allergic Rhinitis We are sorry that you are not feeling well.  Here is how we plan to help!  Based on what you have shared with me it looks like you have Allergic Rhinitis.  Rhinitis is when a reaction occurs that causes nasal congestion, runny nose, sneezing, and itching.  Most types of rhinitis are caused by an inflammation and are associated with symptoms in the eyes ears or throat. There are several types of rhinitis.  The most common are acute rhinitis, which is usually caused by a viral illness, allergic or seasonal rhinitis, and nonallergic or year-round rhinitis.  Nasal allergies occur certain times of the year.  Allergic rhinitis is caused when allergens in the air trigger the release of histamine in the body.  Histamine causes itching, swelling, and fluid to build up in the fragile linings of the nasal passages, sinuses and eyelids.  An itchy nose and clear discharge are common.  I recommend the following over the counter treatments: Xyzal 5 mg take 1 tablet daily  I have prescribed: Prednisone 20 mg take 1 tablet daily for 4 days   HOME CARE:   You can use an over-the-counter saline nasal spray as needed  Avoid areas where there is heavy dust, mites, or molds  Stay indoors on windy days during the pollen season  Keep windows closed in home, at least in bedroom; use air conditioner.  Use high-efficiency house air filter  Keep windows closed in car, turn AC on re-circulate  Avoid playing out with dog during pollen season  GET HELP RIGHT AWAY IF:   If your symptoms do not improve within 10 days  You become short of breath  You develop yellow or green discharge from your nose for over 3 days  You have coughing fits  MAKE SURE YOU:   Understand these instructions  Will watch your condition  Will get help right away if you are not doing well or get worse  Thank you for choosing an e-visit. Your e-visit answers were reviewed by a board certified  advanced clinical practitioner to complete your personal care plan. Depending upon the condition, your plan could have included both over the counter or prescription medications. Please review your pharmacy choice. Be sure that the pharmacy you have chosen is open so that you can pick up your prescription now.  If there is a problem you may message your provider in MyChart to have the prescription routed to another pharmacy. Your safety is important to [place]. If you have drug allergies check your prescription carefully.  For the next 24 hours, you can use MyChart to ask questions about today's visit, request a non-urgent call back, or ask for a work or school excuse from your e-visit provider. You will get an email in the next two days asking about your experience. I hope that your e-visit has been valuable and will speed your recovery.   I spent 5-10 minutes reviewing patient's chart.

## 2020-02-17 ENCOUNTER — Other Ambulatory Visit: Payer: Self-pay | Admitting: Otolaryngology

## 2020-03-05 ENCOUNTER — Telehealth: Payer: Medicaid Other | Admitting: Nurse Practitioner

## 2020-03-05 DIAGNOSIS — B3731 Acute candidiasis of vulva and vagina: Secondary | ICD-10-CM

## 2020-03-05 DIAGNOSIS — B373 Candidiasis of vulva and vagina: Secondary | ICD-10-CM

## 2020-03-05 MED ORDER — FLUCONAZOLE 150 MG PO TABS: 150.0000 mg | ORAL_TABLET | Freq: Once | ORAL | 0 refills | Status: AC

## 2020-03-05 NOTE — Progress Notes (Signed)

## 2020-06-20 ENCOUNTER — Encounter (HOSPITAL_BASED_OUTPATIENT_CLINIC_OR_DEPARTMENT_OTHER): Payer: Self-pay | Admitting: Otolaryngology

## 2020-06-20 ENCOUNTER — Other Ambulatory Visit: Payer: Self-pay

## 2020-06-23 ENCOUNTER — Other Ambulatory Visit (HOSPITAL_COMMUNITY): Payer: Medicaid Other

## 2020-06-24 ENCOUNTER — Other Ambulatory Visit (HOSPITAL_COMMUNITY)
Admission: RE | Admit: 2020-06-24 | Discharge: 2020-06-24 | Disposition: A | Discharge status: Home / Self Care | Payer: BC Managed Care – PPO | Source: Ambulatory Visit | Attending: Otolaryngology | Admitting: Otolaryngology

## 2020-06-24 DIAGNOSIS — J353 Hypertrophy of tonsils with hypertrophy of adenoids: Secondary | ICD-10-CM | POA: Diagnosis present

## 2020-06-24 DIAGNOSIS — Z01812 Encounter for preprocedural laboratory examination: Secondary | ICD-10-CM | POA: Insufficient documentation

## 2020-06-24 DIAGNOSIS — J3501 Chronic tonsillitis: Secondary | ICD-10-CM | POA: Diagnosis not present

## 2020-06-24 DIAGNOSIS — Z20822 Contact with and (suspected) exposure to covid-19: Secondary | ICD-10-CM | POA: Insufficient documentation

## 2020-06-24 DIAGNOSIS — R196 Halitosis: Secondary | ICD-10-CM | POA: Diagnosis not present

## 2020-06-24 LAB — SARS CORONAVIRUS 2 (TAT 6-24 HRS): SARS Coronavirus 2: NEGATIVE

## 2020-06-27 ENCOUNTER — Other Ambulatory Visit: Payer: Self-pay

## 2020-06-27 ENCOUNTER — Encounter (HOSPITAL_BASED_OUTPATIENT_CLINIC_OR_DEPARTMENT_OTHER)
Admission: RE | Disposition: A | Discharge status: Home / Self Care | Payer: Self-pay | Source: Home / Self Care | Attending: Otolaryngology

## 2020-06-27 ENCOUNTER — Ambulatory Visit (HOSPITAL_BASED_OUTPATIENT_CLINIC_OR_DEPARTMENT_OTHER): Payer: BC Managed Care – PPO | Admitting: Anesthesiology

## 2020-06-27 ENCOUNTER — Encounter (HOSPITAL_BASED_OUTPATIENT_CLINIC_OR_DEPARTMENT_OTHER): Payer: Self-pay | Admitting: Otolaryngology

## 2020-06-27 ENCOUNTER — Ambulatory Visit (HOSPITAL_BASED_OUTPATIENT_CLINIC_OR_DEPARTMENT_OTHER)
Admission: RE | Admit: 2020-06-27 | Discharge: 2020-06-27 | Disposition: A | Discharge status: Home / Self Care | Payer: BC Managed Care – PPO | Attending: Otolaryngology | Admitting: Otolaryngology

## 2020-06-27 DIAGNOSIS — J353 Hypertrophy of tonsils with hypertrophy of adenoids: Secondary | ICD-10-CM | POA: Diagnosis not present

## 2020-06-27 DIAGNOSIS — Z20822 Contact with and (suspected) exposure to covid-19: Secondary | ICD-10-CM | POA: Insufficient documentation

## 2020-06-27 DIAGNOSIS — J3501 Chronic tonsillitis: Secondary | ICD-10-CM | POA: Insufficient documentation

## 2020-06-27 DIAGNOSIS — R196 Halitosis: Secondary | ICD-10-CM | POA: Insufficient documentation

## 2020-06-27 HISTORY — PX: TONSILLECTOMY AND ADENOIDECTOMY: SHX28

## 2020-06-27 SURGERY — TONSILLECTOMY AND ADENOIDECTOMY
Anesthesia: General | Site: Throat | Laterality: Bilateral

## 2020-06-27 MED ORDER — FENTANYL CITRATE (PF) 100 MCG/2ML IJ SOLN
INTRAMUSCULAR | Status: AC
  Filled 2020-06-27: qty 2

## 2020-06-27 MED ORDER — AMOXICILLIN 400 MG/5ML PO SUSR: 800.0000 mg | Freq: Two times a day (BID) | ORAL | 0 refills | Status: DC

## 2020-06-27 MED ORDER — OXYCODONE HCL 5 MG PO TABS: 5.0000 mg | ORAL_TABLET | Freq: Once | ORAL | Status: DC | PRN

## 2020-06-27 MED ORDER — OXYMETAZOLINE HCL 0.05 % NA SOLN
NASAL | Status: DC | PRN
  Administered 2020-06-27: 1 via TOPICAL

## 2020-06-27 MED ORDER — LIDOCAINE HCL (CARDIAC) PF 100 MG/5ML IV SOSY
PREFILLED_SYRINGE | INTRAVENOUS | Status: DC | PRN
  Administered 2020-06-27: 100 mg via INTRAVENOUS

## 2020-06-27 MED ORDER — MIDAZOLAM HCL 2 MG/2ML IJ SOLN
INTRAMUSCULAR | Status: AC
  Filled 2020-06-27: qty 2

## 2020-06-27 MED ORDER — LACTATED RINGERS IV SOLN: INTRAVENOUS | Status: DC

## 2020-06-27 MED ORDER — OXYCODONE HCL 5 MG/5ML PO SOLN: 5.0000 mg | Freq: Once | ORAL | Status: DC | PRN

## 2020-06-27 MED ORDER — HYDROCODONE-ACETAMINOPHEN 7.5-325 MG/15ML PO SOLN: 15.0000 mL | ORAL | 0 refills | Status: AC | PRN

## 2020-06-27 MED ORDER — SODIUM CHLORIDE 0.9 % IR SOLN
Status: DC | PRN
  Administered 2020-06-27: 200 mL

## 2020-06-27 MED ORDER — ROCURONIUM BROMIDE 100 MG/10ML IV SOLN
INTRAVENOUS | Status: DC | PRN
  Administered 2020-06-27: 10 mg via INTRAVENOUS

## 2020-06-27 MED ORDER — PROPOFOL 10 MG/ML IV BOLUS
INTRAVENOUS | Status: DC | PRN
  Administered 2020-06-27: 170 mg via INTRAVENOUS

## 2020-06-27 MED ORDER — FENTANYL CITRATE (PF) 100 MCG/2ML IJ SOLN
INTRAMUSCULAR | Status: DC | PRN
  Administered 2020-06-27: 100 ug via INTRAVENOUS

## 2020-06-27 MED ORDER — PROPOFOL 10 MG/ML IV BOLUS
INTRAVENOUS | Status: AC
  Filled 2020-06-27: qty 20

## 2020-06-27 MED ORDER — SCOPOLAMINE 1 MG/3DAYS TD PT72
MEDICATED_PATCH | TRANSDERMAL | Status: AC
  Filled 2020-06-27: qty 1

## 2020-06-27 MED ORDER — HYDROCODONE-ACETAMINOPHEN 7.5-325 MG/15ML PO SOLN: 15.0000 mL | ORAL | 0 refills | Status: DC | PRN

## 2020-06-27 MED ORDER — SCOPOLAMINE 1 MG/3DAYS TD PT72
1.0000 | MEDICATED_PATCH | TRANSDERMAL | Status: DC
  Administered 2020-06-27: 1.5 mg via TRANSDERMAL

## 2020-06-27 MED ORDER — DEXAMETHASONE SODIUM PHOSPHATE 4 MG/ML IJ SOLN
INTRAMUSCULAR | Status: DC | PRN
  Administered 2020-06-27: 10 mg via INTRAVENOUS

## 2020-06-27 MED ORDER — ONDANSETRON HCL 4 MG/2ML IJ SOLN
INTRAMUSCULAR | Status: DC | PRN
  Administered 2020-06-27: 4 mg via INTRAVENOUS

## 2020-06-27 MED ORDER — DEXAMETHASONE SODIUM PHOSPHATE 10 MG/ML IJ SOLN
INTRAMUSCULAR | Status: AC
  Filled 2020-06-27: qty 1

## 2020-06-27 MED ORDER — HYDROMORPHONE HCL 1 MG/ML IJ SOLN: 0.2500 mg | INTRAMUSCULAR | Status: DC | PRN

## 2020-06-27 MED ORDER — DEXMEDETOMIDINE HCL 200 MCG/2ML IV SOLN
INTRAVENOUS | Status: DC | PRN
  Administered 2020-06-27: 12 ug via INTRAVENOUS

## 2020-06-27 MED ORDER — ONDANSETRON HCL 4 MG/2ML IJ SOLN
INTRAMUSCULAR | Status: AC
  Filled 2020-06-27: qty 2

## 2020-06-27 MED ORDER — AMOXICILLIN 400 MG/5ML PO SUSR: 800.0000 mg | Freq: Two times a day (BID) | ORAL | 0 refills | Status: AC

## 2020-06-27 MED ORDER — LIDOCAINE 2% (20 MG/ML) 5 ML SYRINGE
INTRAMUSCULAR | Status: AC
  Filled 2020-06-27: qty 5

## 2020-06-27 MED ORDER — MEPERIDINE HCL 25 MG/ML IJ SOLN: 6.2500 mg | INTRAMUSCULAR | Status: DC | PRN

## 2020-06-27 MED ORDER — MIDAZOLAM HCL 5 MG/5ML IJ SOLN
INTRAMUSCULAR | Status: DC | PRN
  Administered 2020-06-27: 2 mg via INTRAVENOUS

## 2020-06-27 MED ORDER — SUCCINYLCHOLINE CHLORIDE 20 MG/ML IJ SOLN
INTRAMUSCULAR | Status: DC | PRN
  Administered 2020-06-27: 120 mg via INTRAVENOUS

## 2020-06-27 MED ORDER — PROMETHAZINE HCL 25 MG/ML IJ SOLN: 6.2500 mg | INTRAMUSCULAR | Status: DC | PRN

## 2020-06-27 MED ORDER — AMISULPRIDE (ANTIEMETIC) 5 MG/2ML IV SOLN: 10.0000 mg | Freq: Once | INTRAVENOUS | Status: DC | PRN

## 2020-06-27 SURGICAL SUPPLY — 32 items
BNDG COHESIVE 2X5 TAN STRL LF (GAUZE/BANDAGES/DRESSINGS) IMPLANT
CANISTER SUCT 1200ML W/VALVE (MISCELLANEOUS) ×2 IMPLANT
CATH ROBINSON RED A/P 10FR (CATHETERS) IMPLANT
CATH ROBINSON RED A/P 14FR (CATHETERS) ×2 IMPLANT
COAGULATOR SUCT SWTCH 10FR 6 (ELECTROSURGICAL) IMPLANT
COVER BACK TABLE 60X90IN (DRAPES) ×2 IMPLANT
COVER MAYO STAND STRL (DRAPES) ×2 IMPLANT
COVER WAND RF STERILE (DRAPES) IMPLANT
ELECT REM PT RETURN 9FT ADLT (ELECTROSURGICAL) ×2
ELECT REM PT RETURN 9FT PED (ELECTROSURGICAL)
ELECTRODE REM PT RETRN 9FT PED (ELECTROSURGICAL) IMPLANT
ELECTRODE REM PT RTRN 9FT ADLT (ELECTROSURGICAL) ×1 IMPLANT
GAUZE SPONGE 4X4 12PLY STRL LF (GAUZE/BANDAGES/DRESSINGS) ×2 IMPLANT
GLOVE BIO SURGEON STRL SZ7 (GLOVE) ×2 IMPLANT
GLOVE BIO SURGEON STRL SZ7.5 (GLOVE) ×2 IMPLANT
GLOVE BIOGEL PI IND STRL 7.5 (GLOVE) ×1 IMPLANT
GLOVE BIOGEL PI INDICATOR 7.5 (GLOVE) ×1
GOWN STRL REUS W/ TWL LRG LVL3 (GOWN DISPOSABLE) ×2 IMPLANT
GOWN STRL REUS W/TWL LRG LVL3 (GOWN DISPOSABLE) ×4
IV NS 500ML (IV SOLUTION) ×2
IV NS 500ML BAXH (IV SOLUTION) ×1 IMPLANT
MARKER SKIN DUAL TIP RULER LAB (MISCELLANEOUS) IMPLANT
NS IRRIG 1000ML POUR BTL (IV SOLUTION) ×2 IMPLANT
SHEET MEDIUM DRAPE 40X70 STRL (DRAPES) ×2 IMPLANT
SOLUTION BUTLER CLEAR DIP (MISCELLANEOUS) ×2 IMPLANT
SPONGE TONSIL TAPE 1.25 RFD (DISPOSABLE) ×2 IMPLANT
SYR BULB EAR ULCER 3OZ GRN STR (SYRINGE) IMPLANT
TOWEL GREEN STERILE FF (TOWEL DISPOSABLE) ×2 IMPLANT
TUBE CONNECTING 20X1/4 (TUBING) ×2 IMPLANT
TUBE SALEM SUMP 12R W/ARV (TUBING) IMPLANT
TUBE SALEM SUMP 16 FR W/ARV (TUBING) ×2 IMPLANT
WAND COBLATOR 70 EVAC XTRA (SURGICAL WAND) ×2 IMPLANT

## 2020-06-27 NOTE — Anesthesia Postprocedure Evaluation (Signed)
Anesthesia Post Note  Patient: Martha Pollard  Procedure(s) Performed: TONSILLECTOMY AND ADENOIDECTOMY (Bilateral Throat)     Patient location during evaluation: PACU Anesthesia Type: General Level of consciousness: awake and alert Pain management: pain level controlled Vital Signs Assessment: post-procedure vital signs reviewed and stable Respiratory status: spontaneous breathing, nonlabored ventilation and respiratory function stable Cardiovascular status: blood pressure returned to baseline and stable Postop Assessment: no apparent nausea or vomiting Anesthetic complications: no   No complications documented.  Last Vitals:  Vitals:   06/27/20 0957 06/27/20 1015  BP: (!) 106/59 118/78  Pulse: (!) 110 (!) 105  Resp: 19 16  Temp:  37.2 C  SpO2: 100% 100%    Last Pain:  Vitals:   06/27/20 1015  TempSrc:   PainSc: 0-No pain                 Lowella Curb

## 2020-06-27 NOTE — Op Note (Signed)
DATE OF PROCEDURE:  06/27/2020                              OPERATIVE REPORT  SURGEON:  Newman Pies, MD  PREOPERATIVE DIAGNOSES: 1. Adenotonsillar hypertrophy. 2. Chronic tonsillitis and pharyngitis  POSTOPERATIVE DIAGNOSES: 1. Adenotonsillar hypertrophy. 2. Chronic tonsillitis and pharyngitis  PROCEDURE PERFORMED:  Adenotonsillectomy.  ANESTHESIA:  General endotracheal tube anesthesia.  COMPLICATIONS:  None.  ESTIMATED BLOOD LOSS:  Minimal.  INDICATION FOR PROCEDURE:  Martha Pollard is a 24 y.o. female with a history of chronic tonsillitis/pharyngitis and halitosis.  According to the patient, she has been experiencing chronic throat discomfort with halitosis for several years. The patient continued to be symptomatic despite medical treatments. On examination, the patient was noted to have bilateral cryptic tonsils, with numerous tonsilloliths. Based on the above findings, the decision was made for the patient to undergo the adenotonsillectomy procedure. Likelihood of success in reducing symptoms was also discussed.  The risks, benefits, alternatives, and details of the procedure were discussed with the patient.  Questions were invited and answered.  Informed consent was obtained.  DESCRIPTION:  The patient was taken to the operating room and placed supine on the operating table.  General endotracheal tube anesthesia was administered by the anesthesiologist.  The patient was positioned and prepped and draped in a standard fashion for adenotonsillectomy.  A Crowe-Davis mouth gag was inserted into the oral cavity for exposure. 3+ cryptic tonsils were noted bilaterally.  No bifidity was noted.  Indirect mirror examination of the nasopharynx revealed moderate adenoid hypertrophy. The adenoid was ablated with the Coblator device. Hemostasis was achieved with the Coblator device.  The right tonsil was then grasped with a straight Allis clamp and retracted medially.  It was resected free from the  underlying pharyngeal constrictor muscles with the Coblator device.  The same procedure was repeated on the left side without exception.  The surgical sites were copiously irrigated.  The mouth gag was removed.  The care of the patient was turned over to the anesthesiologist.  The patient was awakened from anesthesia without difficulty.  The patient was extubated and transferred to the recovery room in good condition.  OPERATIVE FINDINGS:  Adenotonsillar hypertrophy.  SPECIMEN:  None.  FOLLOWUP CARE:  The patient will be discharged home once awake and alert.  She will be placed on amoxicillin 800 mg p.o. b.i.d. for 5 days, and Hycet elixir for postop pain control.   The patient will follow up in my office in approximately 2 weeks.  [name] W [name] 06/27/2020 9:32 AM

## 2020-06-27 NOTE — Anesthesia Procedure Notes (Signed)
Procedure Name: Intubation Performed by: Verita Lamb, CRNA Pre-anesthesia Checklist: Patient identified, Emergency Drugs available, Suction available and Patient being monitored Patient Re-evaluated:Patient Re-evaluated prior to induction Oxygen Delivery Method: Circle system utilized Preoxygenation: Pre-oxygenation with 100% oxygen Induction Type: IV induction Ventilation: Mask ventilation without difficulty Laryngoscope Size: Mac and 3 Grade View: Grade I Tube type: Oral Tube size: 7.0 mm Number of attempts: 1 Airway Equipment and Method: Stylet and Oral airway Placement Confirmation: ETT inserted through vocal cords under direct vision,  positive ETCO2,  breath sounds checked- equal and bilateral and CO2 detector Secured at: 23 cm Tube secured with: Tape Dental Injury: Teeth and Oropharynx as per pre-operative assessment

## 2020-06-27 NOTE — Discharge Instructions (Addendum)
Post Anesthesia Home Care Instructions  Activity: Get plenty of rest for the remainder of the day. A responsible individual must stay with you for 24 hours following the procedure.  For the next 24 hours, DO NOT: -Drive a car -Advertising copywriter -Drink alcoholic beverages -Take any medication unless instructed by your physician -Make any legal decisions or sign important papers.  Meals: Start with liquid foods such as gelatin or soup. Progress to regular foods as tolerated. Avoid greasy, spicy, heavy foods. If nausea and/or vomiting occur, drink only clear liquids until the nausea and/or vomiting subsides. Call your physician if vomiting continues.  Special Instructions/Symptoms: Your throat may feel dry or sore from the anesthesia or the breathing tube placed in your throat during surgery. If this causes discomfort, gargle with warm salt water. The discomfort should disappear within 24 hours.  If you had a scopolamine patch placed behind your ear for the management of post- operative nausea and/or vomiting:  1. The medication in the patch is effective for 72 hours, after which it should be removed.  Wrap patch in a tissue and discard in the trash. Wash hands thoroughly with soap and water. 2. You may remove the patch earlier than 72 hours if you experience unpleasant side effects which may include dry mouth, dizziness or visual disturbances. 3. Avoid touching the patch. Wash your hands with soap and water after contact with the patch.    ---------------------  [facility] Postoperative Instructions for Tonsillectomy & Adenoidectomy (T&A) Activity Restrict activity at home for the first two days, resting as much as possible. Light indoor activity is best. You may usually return to school or work within a week but void strenuous activity and sports for two weeks. Sleep with your head elevated on 2-3 pillows for 3-4 days to help decrease swelling. Diet Due to tissue swelling  and throat discomfort, you may have little desire to drink for several days. However fluids are very important to prevent dehydration. You will find that non-acidic juices, soups, popsicles, Jell-O, custard, puddings, and any soft or mashed foods taken in small quantities can be swallowed fairly easily. Try to increase your fluid and food intake as the discomfort subsides. It is recommended that a child receive 1-1/2 quarts of fluid in a 24-hour period. Adult require twice this amount.  Discomfort Your sore throat may be relieved by applying an ice collar to your neck and/or by taking Tylenol. You may experience an earache, which is due to referred pain from the throat. Referred ear pain is commonly felt at night when trying to rest.  Bleeding                        Although rare, there is risk of having some bleeding during the first 2 weeks after having a T&A. This usually happens between days 7-10 postoperatively. If you or your child should have any bleeding, try to remain calm. We recommend sitting up quietly in a chair and gently spitting out the blood into a bowl. For adults, gargling gently with ice water may help. If the bleeding does not stop after a short time (5 minutes), is more than 1 teaspoonful, or if you become worried, please call our office at [phone] or go directly to the nearest hospital emergency room. Do not eat or drink anything prior to going to the hospital as you may need to be taken to the operating room in order to control the  bleeding. GENERAL CONSIDERATIONS 1. Brush your teeth regularly. Avoid mouthwashes and gargles for three weeks. You may gargle gently with warm salt-water as necessary or spray with Chloraseptic. You may make salt-water by placing 2 teaspoons of table salt into a quart of fresh water. Warm the salt-water in a microwave to a luke warm temperature.  2. Avoid exposure to colds and upper respiratory infections if possible.  3. If you look into a mirror  or into your child's mouth, you will see white-gray patches in the back of the throat. This is normal after having a T&A and is like a scab that forms on the skin after an abrasion. It will disappear once the back of the throat heals completely. However, it may cause a noticeable odor; this too will disappear with time. Again, warm salt-water gargles may be used to help keep the throat clean and promote healing.  4. You may notice a temporary change in voice quality, such as a higher pitched voice or a nasal sound, until healing is complete. This may last for 1-2 weeks and should resolve.  5. Do not take or give you child any medications that we have not prescribed or recommended.  6. Snoring may occur, especially at night, for the first week after a T&A. It is due to swelling of the soft palate and will usually resolve.  Please call our office at [phone] if you have any questions.     Post Anesthesia Home Care Instructions  Activity: Get plenty of rest for the remainder of the day. A responsible individual must stay with you for 24 hours following the procedure.  For the next 24 hours, DO NOT: -Drive a car -Advertising copywriter -Drink alcoholic beverages -Take any medication unless instructed by your physician -Make any legal decisions or sign important papers.  Meals: Start with liquid foods such as gelatin or soup. Progress to regular foods as tolerated. Avoid greasy, spicy, heavy foods. If nausea and/or vomiting occur, drink only clear liquids until the nausea and/or vomiting subsides. Call your physician if vomiting continues.  Special Instructions/Symptoms: Your throat may feel dry or sore from the anesthesia or the breathing tube placed in your throat during surgery. If this causes discomfort, gargle with warm salt water. The discomfort should disappear within 24 hours.  If you had a scopolamine patch placed behind your ear for the management of post- operative nausea and/or  vomiting:  1. The medication in the patch is effective for 72 hours, after which it should be removed.  Wrap patch in a tissue and discard in the trash. Wash hands thoroughly with soap and water. 2. You may remove the patch earlier than 72 hours if you experience unpleasant side effects which may include dry mouth, dizziness or visual disturbances. 3. Avoid touching the patch. Wash your hands with soap and water after contact with the patch.

## 2020-06-27 NOTE — Anesthesia Preprocedure Evaluation (Signed)
Anesthesia Evaluation  Patient identified by MRN, date of birth, ID band Patient awake    Reviewed: Allergy & Precautions, H&P , NPO status , Patient's Chart, lab work & pertinent test results  Airway Mallampati: II  TM Distance: >3 FB Neck ROM: Full    Dental no notable dental hx.    Pulmonary neg pulmonary ROS,    Pulmonary exam normal breath sounds clear to auscultation       Cardiovascular negative cardio ROS Normal cardiovascular exam Rhythm:Regular Rate:Normal     Neuro/Psych negative neurological ROS  negative psych ROS   GI/Hepatic negative GI ROS, Neg liver ROS,   Endo/Other  negative endocrine ROS  Renal/GU negative Renal ROS  negative genitourinary   Musculoskeletal negative musculoskeletal ROS (+)   Abdominal (+) + obese,   Peds negative pediatric ROS (+)  Hematology negative hematology ROS (+)   Anesthesia Other Findings   Reproductive/Obstetrics negative OB ROS                             Anesthesia Physical Anesthesia Plan  ASA: II  Anesthesia Plan: General   Post-op Pain Management:    Induction: Intravenous  PONV Risk Score and Plan: 3 and Ondansetron, Dexamethasone, Midazolam and Treatment may vary due to age or medical condition  Airway Management Planned: Oral ETT  Additional Equipment:   Intra-op Plan:   Post-operative Plan: Extubation in OR  Informed Consent: I have reviewed the patients History and Physical, chart, labs and discussed the procedure including the risks, benefits and alternatives for the proposed anesthesia with the patient or authorized representative who has indicated his/her understanding and acceptance.     Dental advisory given  Plan Discussed with: CRNA  Anesthesia Plan Comments:         Anesthesia Quick Evaluation  

## 2020-06-27 NOTE — H&P (Signed)
Cc: Recurrent tonsillitis  HPI: The patient is a 24 y/o female who presents today for evaluation of chronic tonsillitis. The patient is seen in consultation requested by Dr. Sidney Ace. The patient has noted issues with her tonsils for the past 3 years. She notes frequent sore throat, tonsil stones, and halitosis. The patient also snores when the tonsils are enlarged. The patient has a history of allergies and is currently undergoing immunotherapy. She is on Flonase and Azelastine daily. The patient currently denies facial pain or pressure. Previous ENT surgery is denied.   The patient's review of systems (constitutional, eyes, ENT, cardiovascular, respiratory, GI, musculoskeletal, skin, neurologic, psychiatric, endocrine, hematologic, allergic) is noted in the ROS questionnaire.  It is reviewed with the patient.    Family health history: Heart disease.  Major events: None.  Ongoing medical problems: Sickle cell trait, allergies, hay fever.  Social history: The patient is single. She denies the use of tobacco or illegal drugs. She rarely drinks alcohol.   Exam: General: Communicates without difficulty, well nourished, no acute distress. Head:  Normocephalic, no lesions or asymmetry. Eyes: PERRL, EOMI. No scleral icterus, conjunctivae clear.  Neuro: CN II exam reveals vision grossly intact.  No nystagmus at any point of gaze. Ears:  EAC normal without erythema AU.  TM intact without fluid and mobile AU. Nose: Moist, congested mucosa without lesions or mass. Mouth: Oral cavity clear and moist, no lesions, tonsils symmetric. Tonsils are 3+. Tonsils with mild erythema. Neck: Full range of motion, no lymphadenopathy or masses.   Assessment  1.  The patient's history and physical exam findings are consistent with chronic tonsillitis/pharyngitis secondary to adenotonsillar hypertrophy.  Plan 1. The treatment options include continuing conservative observation versus adenotonsillectomy.  Based on the  patient's history and physical exam findings, the patient will likely benefit from having the tonsils and adenoid removed.  The risks, benefits, alternatives, and details of the procedure are reviewed with the patient.  Questions are invited and answered.  2. The patient is interested in proceeding with the procedure.  We will schedule the procedure in accordance with the family schedule.

## 2020-06-27 NOTE — Transfer of Care (Signed)
Immediate Anesthesia Transfer of Care Note  Patient: Martha Pollard  Procedure(s) Performed: TONSILLECTOMY AND ADENOIDECTOMY (Bilateral Throat)  Patient Location: PACU  Anesthesia Type:General  Level of Consciousness: awake, alert  and oriented  Airway & Oxygen Therapy: Patient Spontanous Breathing and Patient connected to face mask oxygen  Post-op Assessment: Report given to RN and Post -op Vital signs reviewed and stable  Post vital signs: Reviewed and stable  Last Vitals:  Vitals Value Taken Time  BP    Temp    Pulse    Resp    SpO2      Last Pain:  Vitals:   06/27/20 0721  TempSrc: Oral  PainSc: 0-No pain      Patients Stated Pain Goal: 1 (06/27/20 0721)  Complications: No complications documented.

## 2020-06-29 ENCOUNTER — Encounter (HOSPITAL_BASED_OUTPATIENT_CLINIC_OR_DEPARTMENT_OTHER): Payer: Self-pay | Admitting: Otolaryngology

## 2020-07-19 ENCOUNTER — Telehealth: Payer: BC Managed Care – PPO | Admitting: Nurse Practitioner

## 2020-07-19 DIAGNOSIS — B3731 Acute candidiasis of vulva and vagina: Secondary | ICD-10-CM

## 2020-07-19 DIAGNOSIS — B373 Candidiasis of vulva and vagina: Secondary | ICD-10-CM

## 2020-07-19 MED ORDER — FLUCONAZOLE 150 MG PO TABS: 150.0000 mg | ORAL_TABLET | Freq: Once | ORAL | 0 refills | Status: AC

## 2020-07-19 NOTE — Progress Notes (Signed)
We are sorry that you are not feeling well. Here is how we plan to help! Based on what you shared with me it looks like you: May have a yeast vaginosis  Vaginosis is an inflammation of the vagina that can result in discharge, itching and pain. The cause is usually a change in the normal balance of vaginal bacteria or an infection. Vaginosis can also result from reduced estrogen levels after menopause.  The most common causes of vaginosis are:   Bacterial vaginosis which results from an overgrowth of one on several organisms that are normally present in your vagina.   Yeast infections which are caused by a naturally occurring fungus called candida.   Vaginal atrophy (atrophic vaginosis) which results from the thinning of the vagina from reduced estrogen levels after menopause.   Trichomoniasis which is caused by a parasite and is commonly transmitted by sexual intercourse.  Factors that increase your risk of developing vaginosis include: Marland Kitchen Medications, such as antibiotics and steroids . Uncontrolled diabetes . Use of hygiene products such as bubble bath, vaginal spray or vaginal deodorant . Douching . Wearing damp or tight-fitting clothing . Using an intrauterine device (IUD) for birth control . Hormonal changes, such as those associated with pregnancy, birth control pills or menopause . Sexual activity . Having a sexually transmitted infection  Your treatment plan is A single Diflucan (fluconazole) 150mg  tablet once.  I have electronically sent this prescription into the pharmacy that you have chosen. You can get cream OTC to use topically.  Be sure to take all of the medication as directed. Stop taking any medication if you develop a rash, tongue swelling or shortness of breath. Mothers who are breast feeding should consider pumping and discarding their breast milk while on these antibiotics. However, there is no consensus that infant exposure at these doses would be harmful.  Remember  that medication creams can weaken latex condoms.   HOME CARE:  Good hygiene may prevent some types of vaginosis from recurring and may relieve some symptoms:  . Avoid baths, hot tubs and whirlpool spas. Rinse soap from your outer genital area after a shower, and dry the area well to prevent irritation. Don't use scented or harsh soaps, such as those with deodorant or antibacterial action. Marland Kitchen Avoid irritants. These include scented tampons and pads. . Wipe from front to back after using the toilet. Doing so avoids spreading fecal bacteria to your vagina.  Other things that may help prevent vaginosis include:  Marland Kitchen Don't douche. Your vagina doesn't require cleansing other than normal bathing. Repetitive douching disrupts the normal organisms that reside in the vagina and can actually increase your risk of vaginal infection. Douching won't clear up a vaginal infection. . Use a latex condom. Both female and female latex condoms may help you avoid infections spread by sexual contact. . Wear cotton underwear. Also wear pantyhose with a cotton crotch. If you feel comfortable without it, skip wearing underwear to bed. Yeast thrives in Marland Kitchen Your symptoms should improve in the next day or two.  GET HELP RIGHT AWAY IF:  . You have pain in your lower abdomen ( pelvic area or over your ovaries) . You develop nausea or vomiting . You develop a fever . Your discharge changes or worsens . You have persistent pain with intercourse . You develop shortness of breath, a rapid pulse, or you faint.  These symptoms could be signs of problems or infections that need to be evaluated by a medical  provider now.  MAKE SURE YOU    Understand these instructions.  Will watch your condition.  Will get help right away if you are not doing well or get worse.  Your e-visit answers were reviewed by a board certified advanced clinical practitioner to complete your personal care plan. Depending upon the  condition, your plan could have included both over the counter or prescription medications. Please review your pharmacy choice to make sure that you have choses a pharmacy that is open for you to pick up any needed prescription, Your safety is important to [place]. If you have drug allergies check your prescription carefully.   You can use MyChart to ask questions about today's visit, request a non-urgent call back, or ask for a work or school excuse for 24 hours related to this e-Visit. If it has been greater than 24 hours you will need to follow up with your provider, or enter a new e-Visit to address those concerns. You will get a MyChart message within the next two days asking about your experience. I hope that your e-visit has been valuable and will speed your recovery.   5-10 minutes spent reviewing and documenting in chart.

## 2020-07-29 ENCOUNTER — Other Ambulatory Visit: Payer: BC Managed Care – PPO

## 2020-09-13 ENCOUNTER — Other Ambulatory Visit: Payer: Self-pay | Admitting: Obstetrics and Gynecology

## 2020-09-13 DIAGNOSIS — N63 Unspecified lump in unspecified breast: Secondary | ICD-10-CM

## 2020-09-13 DIAGNOSIS — N6310 Unspecified lump in the right breast, unspecified quadrant: Secondary | ICD-10-CM

## 2020-09-22 ENCOUNTER — Ambulatory Visit
Admission: RE | Admit: 2020-09-22 | Discharge: 2020-09-22 | Disposition: A | Discharge status: Home / Self Care | Payer: BC Managed Care – PPO | Source: Ambulatory Visit | Attending: Obstetrics and Gynecology | Admitting: Obstetrics and Gynecology

## 2020-09-22 ENCOUNTER — Other Ambulatory Visit: Payer: Self-pay

## 2020-09-22 DIAGNOSIS — N6310 Unspecified lump in the right breast, unspecified quadrant: Secondary | ICD-10-CM

## 2021-01-14 ENCOUNTER — Telehealth: Payer: Medicaid Other | Admitting: Nurse Practitioner

## 2021-01-14 ENCOUNTER — Encounter: Payer: Self-pay | Admitting: Nurse Practitioner

## 2021-01-14 DIAGNOSIS — K5900 Constipation, unspecified: Secondary | ICD-10-CM

## 2021-01-14 NOTE — Progress Notes (Signed)
Virtual Visit Consent   Martha Pollard, you are scheduled for a virtual visit with Martha Daphine Deutscher, FNP, a [facility] provider, today.     Just as with appointments in the office, your consent must be obtained to participate.  Your consent will be active for this visit and any virtual visit you may have with one of our providers in the next 365 days.     If you have a MyChart account, a copy of this consent can be sent to you electronically.  All virtual visits are billed to your insurance company just like a traditional visit in the office.    As this is a virtual visit, video technology does not allow for your provider to perform a traditional examination.  This may limit your provider's ability to fully assess your condition.  If your provider identifies any concerns that need to be evaluated in person or the need to arrange testing (such as labs, EKG, etc.), we will make arrangements to do so.     Although advances in technology are sophisticated, we cannot ensure that it will always work on either your end or our end.  If the connection with a video visit is poor, the visit may have to be switched to a telephone visit.  With either a video or telephone visit, we are not always able to ensure that we have a secure connection.     I need to obtain your verbal consent now.   Are you willing to proceed with your visit today? YES   Martha Pollard has provided verbal consent on 01/14/2021 for a virtual visit (video or telephone).   Martha Daphine Deutscher, FNP   Date: 01/14/2021 8:52 AM   Virtual Visit via Video Note   I, Martha Pollard, connected with Martha Pollard ([id number], 07-Jan-1996) on 01/14/21 at  8:45 AM EDT by a video-enabled telemedicine application and verified that I am speaking with the correct person using two identifiers.  Location: Patient: Virtual Visit Location Patient: Home Provider: Virtual Visit Location Provider: Mobile   I discussed the  limitations of evaluation and management by telemedicine and the availability of in person appointments. The patient expressed understanding and agreed to proceed.    History of Present Illness: Martha Pollard is a 25 y.o. who identifies as a female who was assigned female at birth, and is being seen today for abdominal pain.  HPI: HPI  Patient has been having abdominal pain near right lower quadrant for 3 days. She has not officially been dx with IBS, but her PCP thinks that she has it. This is the longest time that she has had this pain. Pain is intermittent and she rates it a 7/10. Nothing seems to help except heating pad. She says that she is really having to strain to have a bowel movement. She has tried gasx and miralax. Problems:  Patient Active Problem List   Diagnosis Date Noted   Palpitations 10/16/2016    Allergies:  Allergies  Allergen Reactions   Ibuprofen Swelling    Facial swelling   Medications:  Current Outpatient Medications:    azelastine (ASTELIN) 0.1 % nasal spray, Place 2 sprays into both nostrils 2 (two) times daily. Use in each nostril as directed, Disp: 30 mL, Rfl: 0   GuaiFENesin (MUCINEX PO), Take 30 mLs by mouth every 6 (six) hours as needed (cough, congestion)., Disp: , Rfl:    lactobacillus acidophilus (BACID) TABS tablet, Take 2 tablets by mouth 3 (three) times  daily., Disp: , Rfl:    loratadine (CLARITIN) 10 MG tablet, Take 10 mg by mouth daily., Disp: , Rfl:    nystatin cream (MYCOSTATIN), Apply 1 application topically 2 (two) times daily., Disp: 30 g, Rfl: 0  Observations/Objective: Patient is well-developed, well-nourished in no acute distress.  Resting comfortably  at home.  Head is normocephalic, atraumatic.  No labored breathing.  Speech is clear and coherent with logical content.  Patient is alert and oriented at baseline.    Assessment and Plan:  Martha Pollard in today with chief complaint of No chief complaint on file.   1.  Constipation, unspecified constipation type Milk of magnesia and 6 oz of prune juice-repeat in 6 hours if no result Increase fiber in diet Drink lots of liquids See PCP if not improving      Follow Up Instructions: I discussed the assessment and treatment plan with the patient. The patient was provided an opportunity to ask questions and all were answered. The patient agreed with the plan and demonstrated an understanding of the instructions.  A copy of instructions were sent to the patient via MyChart.  The patient was advised to call back or seek an in-person evaluation if the symptoms worsen or if the condition fails to improve as anticipated.  Time:  I spent 10 minutes with the patient via telehealth technology discussing the above problems/concerns.    Martha Daphine Deutscher, FNP

## 2021-01-14 NOTE — Patient Instructions (Signed)
Irritable Bowel Syndrome, Adult  Irritable bowel syndrome (IBS) is a group of symptoms that affects the organs responsible for digestion (gastrointestinal or GI tract). IBS is not one specific disease. To regulate how the GI tract works, the body sends signals back and forth between the intestines and the brain. If you have IBS, there may be a problem with these signals. As a result, the GI tract does not function normally. The intestines may become more sensitive and overreact to certain things. This maybe especially true when you eat certain foods or when you are under stress. There are four types of IBS. These may be determined based on the consistency of your stool (feces): IBS with diarrhea. IBS with constipation. Mixed IBS. Unsubtyped IBS. It is important to know which type of IBS you have. Certain treatments are morelikely to be helpful for certain types of IBS. What are the causes? The exact cause of IBS is not known. What increases the risk? You may have a higher risk for IBS if you: Are female. Are younger than 40. Have a family history of IBS. Have a mental health condition, such as depression, anxiety, or post-traumatic stress disorder. Have had a bacterial infection of your GI tract. What are the signs or symptoms? Symptoms of IBS vary from person to person. The main symptom is abdominal pain or discomfort. Other symptoms usually include one or more of the following: Diarrhea, constipation, or both. Abdominal swelling or bloating. Feeling full after eating a small or regular-sized meal. Frequent gas. Mucus in the stool. A feeling of having more stool left after a bowel movement. Symptoms tend to come and go. They may be triggered by stress, mental healthconditions, or certain foods. How is this diagnosed? This condition may be diagnosed based on a physical exam, your medical history, and your symptoms. You may have tests, such as: Blood tests. Stool test. X-rays. CT  scan. Colonoscopy. This is a procedure in which your GI tract is viewed with a long, thin, flexible tube. How is this treated? There is no cure for IBS, but treatment can help relieve symptoms. Treatment depends on the type of IBS you have, and may include: Changes to your diet, such as: Avoiding foods that cause symptoms. Drinking more water. Following a low-FODMAP (fermentable oligosaccharides, disaccharides, monosaccharides, and polyols) diet for up to 6 weeks, or as told by your health care provider. FODMAPs are sugars that are hard for some people to digest. Eating more fiber. Eating medium-sized meals at the same times every day. Medicines. These may include: Fiber supplements, if you have constipation. Medicine to control diarrhea (antidiarrheal medicines). Medicine to help control muscle tightening (spasms) in your GI tract (antispasmodic medicines). Medicines to help with mental health conditions, such as antidepressants or tranquilizers. Talk therapy or counseling. Working with a diet and nutrition specialist (dietitian) to help create a food plan that is right for you. Managing your stress. Follow these instructions at home: Eating and drinking Eat a healthy diet. Eat medium-sized meals at about the same time every day. Do not eat large meals. Gradually eat more fiber-rich foods. These include whole grains, fruits, and vegetables. This may be especially helpful if you have IBS with constipation. Eat a diet low in FODMAPs. Drink enough fluid to keep your urine pale yellow. Keep a journal of foods that seem to trigger symptoms. Avoid foods and drinks that: Contain added sugar. Make your symptoms worse. Dairy products, caffeinated drinks, and carbonated drinks can make symptoms worse for some   people. General instructions Take over-the-counter and prescription medicines and supplements only as told by your health care provider. Get enough exercise. Do at least 150 minutes of  moderate-intensity exercise each week. Manage your stress. Getting enough sleep and exercise can help you manage stress. Keep all follow-up visits as told by your health care provider and therapist. This is important. Alcohol Use Do not drink alcohol if: Your health care provider tells you not to drink. You are pregnant, may be pregnant, or are planning to become pregnant. If you drink alcohol, limit how much you have: 0-1 drink a day for women. 0-2 drinks a day for men. Be aware of how much alcohol is in your drink. In the [place] one drink equals one typical bottle of beer (12 oz), one-half glass of wine (5 oz), or one shot of hard liquor (1 oz). Contact a health care provider if you have: Constant pain. Weight loss. Difficulty or pain when swallowing. Diarrhea that gets worse. Get help right away if you have: Severe abdominal pain. Fever. Diarrhea with symptoms of dehydration, such as dizziness or dry mouth. Bright red blood in your stool. Stool that is black and tarry. Abdominal swelling. Vomiting that does not stop. Blood in your vomit. Summary Irritable bowel syndrome (IBS) is not one specific disease. It is a group of symptoms that affects digestion. Your intestines may become more sensitive and overreact to certain things. This may be especially true when you eat certain foods or when you are under stress. There is no cure for IBS, but treatment can help relieve symptoms. This information is not intended to replace advice given to you by your health care provider. Make sure you discuss any questions you have with your healthcare provider. Document Revised: 02/25/2020 Document Reviewed: 02/25/2020 Elsevier Patient Education  2022 Elsevier Inc. Diet for Irritable Bowel Syndrome When you have irritable bowel syndrome (IBS), it is very important to eat the foods and follow the eating habits that are best for your condition. IBS may cause various symptoms such as pain in the  abdomen, constipation, or diarrhea. Choosing the right foods can help to ease the discomfort from these symptoms. Work with your health care provider and diet and nutrition specialist (dietitian) to find the eating plan that will help to control your symptoms. What are tips for following this plan?     Keep a food diary. This will help you identify foods that cause symptoms. Write down: What you eat and when you eat it. What symptoms you have. When symptoms occur in relation to your meals, such as "pain in abdomen 2 hours after dinner." Eat your meals slowly and in a relaxed setting. Aim to eat 5-6 small meals per day. Do not skip meals. Drink enough fluid to keep your urine pale yellow. Ask your health care provider if you should take an over-the-counter probiotic to help restore healthy bacteria in your gut (digestive tract). Probiotics are foods that contain good bacteria and yeasts. Your dietitian may have specific dietary recommendations for you based on your symptoms. He or she may recommend that you: Avoid foods that cause symptoms. Talk with your dietitian about other ways to get the same nutrients that are in those problem foods. Avoid foods with gluten. Gluten is a protein that is found in rye, wheat, and barley. Eat more foods that contain soluble fiber. Examples of foods with high soluble fiber include oats, seeds, and certain fruits and vegetables. Take a fiber supplement if directed by your  dietitian. Reduce or avoid certain foods called FODMAPs. These are foods that contain carbohydrates that are hard to digest. Ask your doctor which foods contain these carbohydrates. What foods are not recommended? The following are some foods and drinks that may make your symptoms worse: Fatty foods, such as french fries. Foods that contain gluten, such as pasta and cereal. Dairy products, such as milk, cheese, and ice cream. Chocolate. Alcohol. Products with caffeine, such as  coffee. Carbonated drinks, such as soda. Foods that are high in FODMAPs. These include certain fruits and vegetables. Products with sweeteners such as honey, high fructose corn syrup, sorbitol, and mannitol. The items listed above may not be a complete list of foods and beverages youshould avoid. Contact a dietitian for more information. What foods are good sources of fiber? Your health care provider or dietitian may recommend that you eat more foods that contain fiber. Fiber can help to reduce constipation and other IBS symptoms. Add foods with fiber to your diet a little at a time so your body can get used to them. Too much fiber at one time might cause gas and swelling of your abdomen. The following are some foods that are good sources of fiber: Berries, such as raspberries, strawberries, and blueberries. Tomatoes. Carrots. Brown rice. Oats. Seeds, such as chia and pumpkin seeds. The items listed above may not be a complete list of recommended sources offiber. Contact your dietitian for more options. Where to find more information International Foundation for Functional Gastrointestinal Disorders: www.iffgd.[employer] of Diabetes and Digestive and Kidney Diseases: CarFlippers.tn Summary When you have irritable bowel syndrome (IBS), it is very important to eat the foods and follow the eating habits that are best for your condition. IBS may cause various symptoms such as pain in the abdomen, constipation, or diarrhea. Choosing the right foods can help to ease the discomfort that comes from symptoms. Keep a food diary. This will help you identify foods that cause symptoms. Your health care provider or diet and nutrition specialist (dietitian) may recommend that you eat more foods that contain fiber. This information is not intended to replace advice given to you by your health care provider. Make sure you discuss any questions you have with your healthcare provider. Document  Revised: 02/25/2020 Document Reviewed: 02/25/2020 Elsevier Patient Education  2022 [employer].

## 2021-09-18 ENCOUNTER — Other Ambulatory Visit: Payer: Self-pay

## 2021-09-18 ENCOUNTER — Ambulatory Visit: Payer: Medicaid Other

## 2021-09-18 ENCOUNTER — Ambulatory Visit
Admission: EM | Admit: 2021-09-18 | Discharge: 2021-09-18 | Discharge status: Against Medical Advice | Payer: No Typology Code available for payment source

## 2021-11-01 ENCOUNTER — Other Ambulatory Visit (HOSPITAL_COMMUNITY): Payer: Self-pay

## 2021-11-01 MED ORDER — LEVONORGEST-ETH ESTRAD 91-DAY 0.1-0.02 & 0.01 MG PO TABS
ORAL_TABLET | ORAL | 3 refills | Status: DC
  Filled 2021-11-01: qty 91, 91d supply, fill #0

## 2021-11-13 ENCOUNTER — Other Ambulatory Visit (HOSPITAL_COMMUNITY): Payer: Self-pay

## 2021-11-13 MED ORDER — LEVONORGEST-ETH ESTRAD 91-DAY 0.1-0.02 & 0.01 MG PO TABS
ORAL_TABLET | ORAL | 3 refills | Status: DC
  Filled 2021-11-13: qty 91, 91d supply, fill #0

## 2021-11-19 ENCOUNTER — Ambulatory Visit: Payer: Self-pay

## 2022-01-25 ENCOUNTER — Other Ambulatory Visit (HOSPITAL_COMMUNITY): Payer: Self-pay

## 2022-01-25 MED ORDER — BUSPIRONE HCL 5 MG PO TABS
ORAL_TABLET | ORAL | 1 refills | Status: DC
  Filled 2022-01-25: qty 60, 30d supply, fill #0
  Filled 2022-03-04: qty 60, 30d supply, fill #1

## 2022-02-01 ENCOUNTER — Ambulatory Visit (INDEPENDENT_AMBULATORY_CARE_PROVIDER_SITE_OTHER): Payer: No Typology Code available for payment source | Admitting: Behavioral Health

## 2022-02-01 DIAGNOSIS — F419 Anxiety disorder, unspecified: Secondary | ICD-10-CM

## 2022-02-09 ENCOUNTER — Other Ambulatory Visit (HOSPITAL_COMMUNITY): Payer: Self-pay

## 2022-02-09 MED ORDER — BUSPIRONE HCL 10 MG PO TABS
ORAL_TABLET | ORAL | 1 refills | Status: DC
  Filled 2022-02-09: qty 60, 30d supply, fill #0

## 2022-02-19 NOTE — Progress Notes (Addendum)
[facility] Counselor Initial Adult Exam  Name: JONTE WOLLAM Date: 02/19/2022 MRN: [id number] DOB: Jun 08, 1996 PCP: Laurann Montana, MD  Time spent: 60 min NuPt Intake on telephone; Pt is home in private/Clinician @ [facility] on Webex video  Guardian/Payee:  Self    Paperwork requested: No   Reason for Visit /Presenting Problem: elevated anx/dep  Mental Status Exam: Appearance:   NA     Behavior:  Appropriate  Motor:  Normal  Speech/Language:   Normal Rate  Affect:  Congruent  Mood:  normal  Thought process:  normal  Thought content:    WNL  Sensory/Perceptual disturbances:    WNL  Orientation:  oriented to person, place, and time/date  Attention:  Good  Concentration:  Good  Memory:  WNL  Fund of knowledge:   Good  Insight:    Good  Judgment:   Good  Impulse Control:  Good    Reported Symptoms:  low E, low motivation, Sx that present when anxiety occurs; "ears clog, head gets foggy, feels like auto-pilot, & over-thinking"  Risk Assessment: Danger to Self:  No Self-injurious Behavior: No Danger to Others: No Duty to Warn:no Physical Aggression / Violence:No  Access to Firearms a concern: No  Gang Involvement:No  Patient / guardian was educated about steps to take if suicide or homicide risk level increases between visits: n/a While future psychiatric events cannot be accurately predicted, the patient does not currently require acute inpatient psychiatric care and does not currently meet [facility] involuntary commitment criteria.  Substance Abuse History: Current substance abuse: No     Past Psychiatric History:   No previous psychological problems have been observed Outpatient Providers: Dr. Laurann Montana, MD [facility] [address]) History of Psych Hospitalization: No  Psychological Testing:  none noted today    Abuse History:  Victim of: No.,  NA    Report needed: No. Victim of Neglect:No. Perpetrator of  NA    Witness / Exposure to Domestic Violence: No   Protective Services Involvement: No  Witness to [employer] Violence:  No   Family History: Pt's Mother died when she was 3yo & her Gparents raised her in [place]. Her Father is not in the picture. Pt has an incomplete picture of her Family Health Hx.  Living situation: the patient lives alone  Sexual Orientation: Straight  Relationship Status: single  Name of spouse / other: No Sig Other noted If a parent, number of children / ages: NA  Support Systems: friends; one in [place] texts a lot & visits [place] to see her own Family Gparents No Siblings  Dog Theo!  Financial Stress:  No   Income/Employment/Disability: Employment; Pt works @ [employer] & enjoys work & Co-Workers  Financial planner: No   Educational History: Education: some college @  [employer], [place], [place], [employer]  Religion/Sprituality/World View: Not noted today  Any cultural differences that may affect / interfere with treatment:  none noted today  Recreation/Hobbies: time w/Dog Danice Goltz, reads a lot!  Stressors: Other: elevated anx/dep & overthinking everything!    Strengths: Supportive Relationships, Family, Friends, and Able to Communicate Effectively  Barriers:  None noted today   Legal History: Pending legal issue / charges: The patient has no significant history of legal issues. History of legal issue / charges:  NA  Medical History/Surgical History: reviewed No past medical history on file.  Past Surgical History:  Procedure Laterality Date   BREAST BIOPSY  right   TONSILLECTOMY AND ADENOIDECTOMY Bilateral 06/27/2020   Procedure: TONSILLECTOMY AND ADENOIDECTOMY;  Surgeon: Newman Pies, MD;  Location: [facility];  Service: ENT;  Laterality: Bilateral;   WISDOM TOOTH EXTRACTION      Medications: Current Outpatient Medications  Medication Sig Dispense Refill   azelastine (ASTELIN) 0.1 % nasal spray Place 2 sprays into both nostrils 2  (two) times daily. Use in each nostril as directed 30 mL 0   busPIRone (BUSPAR) 10 MG tablet Take 1 tablet by mouth twice daily. 60 tablet 1   busPIRone (BUSPAR) 5 MG tablet Take 1 tablet by mouth twice a day 60 tablet 1   GuaiFENesin (MUCINEX PO) Take 30 mLs by mouth every 6 (six) hours as needed (cough, congestion).     lactobacillus acidophilus (BACID) TABS tablet Take 2 tablets by mouth 3 (three) times daily.     Levonorgestrel-Ethinyl Estradiol (AMETHIA) 0.1-0.02 & 0.01 MG tablet Take 1 tablet by mouth every day. 91 tablet 3   loratadine (CLARITIN) 10 MG tablet Take 10 mg by mouth daily.     nystatin cream (MYCOSTATIN) Apply 1 application topically 2 (two) times daily. 30 g 0   No current facility-administered medications for this visit.    Allergies  Allergen Reactions   Ibuprofen Swelling    Facial swelling    Diagnoses: Anxiety   Plan of Care: Pt will use calm.com app prn Pt will keep Notebook btwn sessions & noted which issues imp to psychotherapy process   Deneise Lever, LMFT

## 2022-02-19 NOTE — Progress Notes (Signed)
                Raelie Lohr L Marg Macmaster, LMFT 

## 2022-02-20 ENCOUNTER — Other Ambulatory Visit (HOSPITAL_COMMUNITY): Payer: Self-pay

## 2022-03-05 ENCOUNTER — Other Ambulatory Visit (HOSPITAL_COMMUNITY): Payer: Self-pay

## 2022-03-21 ENCOUNTER — Other Ambulatory Visit (HOSPITAL_COMMUNITY): Payer: Self-pay

## 2022-03-23 ENCOUNTER — Other Ambulatory Visit (HOSPITAL_COMMUNITY): Payer: Self-pay

## 2022-04-09 IMAGING — US US BREAST*R* LIMITED INC AXILLA
1 series · 4 of 4 positions shown · non-contrast
Comparison: Previous right breast ultrasound 10/25/2008

CLINICAL DATA: Patient presents for evaluation of palpable
abnormality within the right breast. Patient states the palpable
abnormality is unchanged compared to prior years.

EXAM:
ULTRASOUND OF THE RIGHT BREAST

[Series 1: us breast*right* limited inc axilla · 0.06mm/px · 4 of 4 slices shown]
[im 1/4]
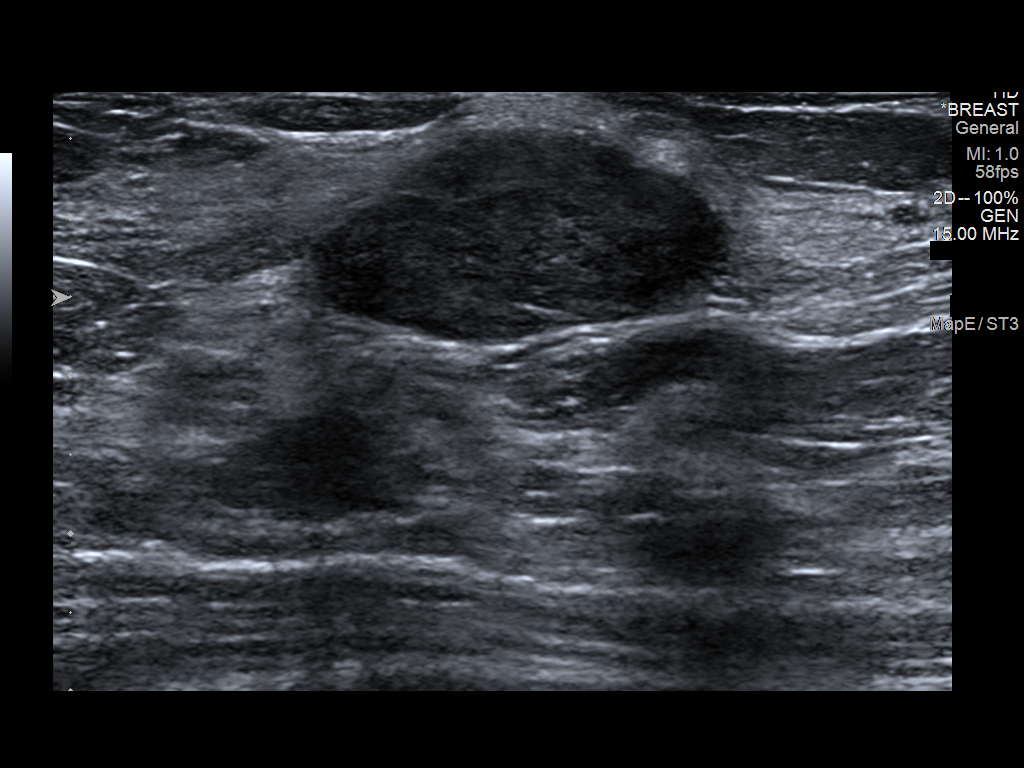
[im 2/4]
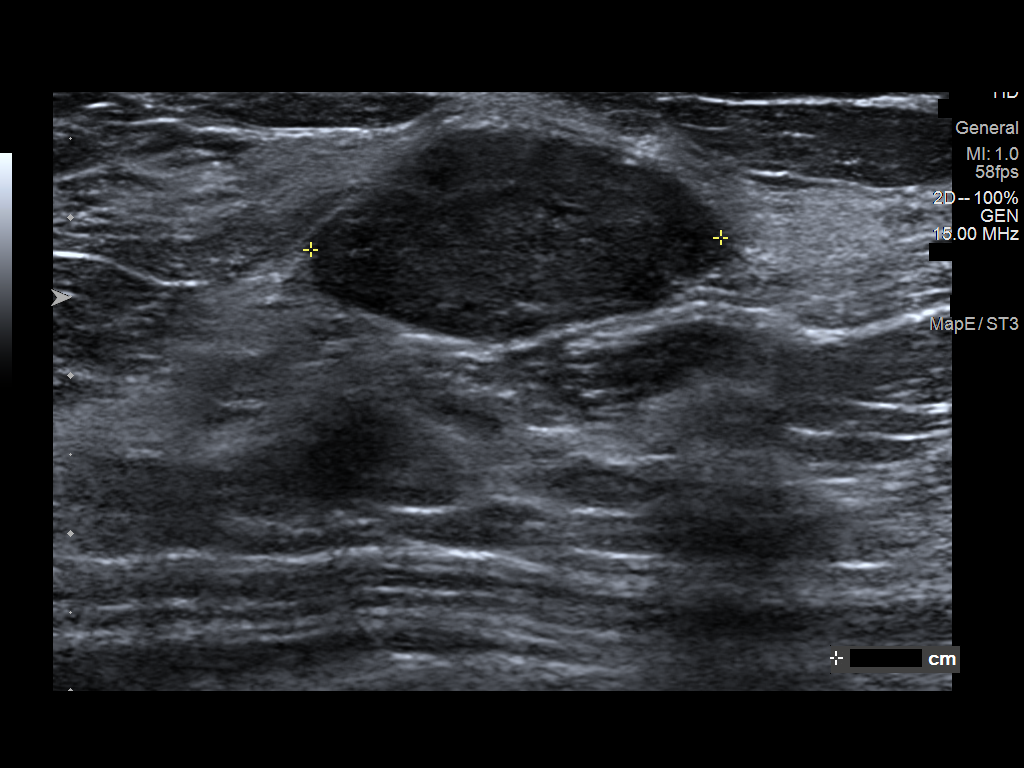
[im 3/4]
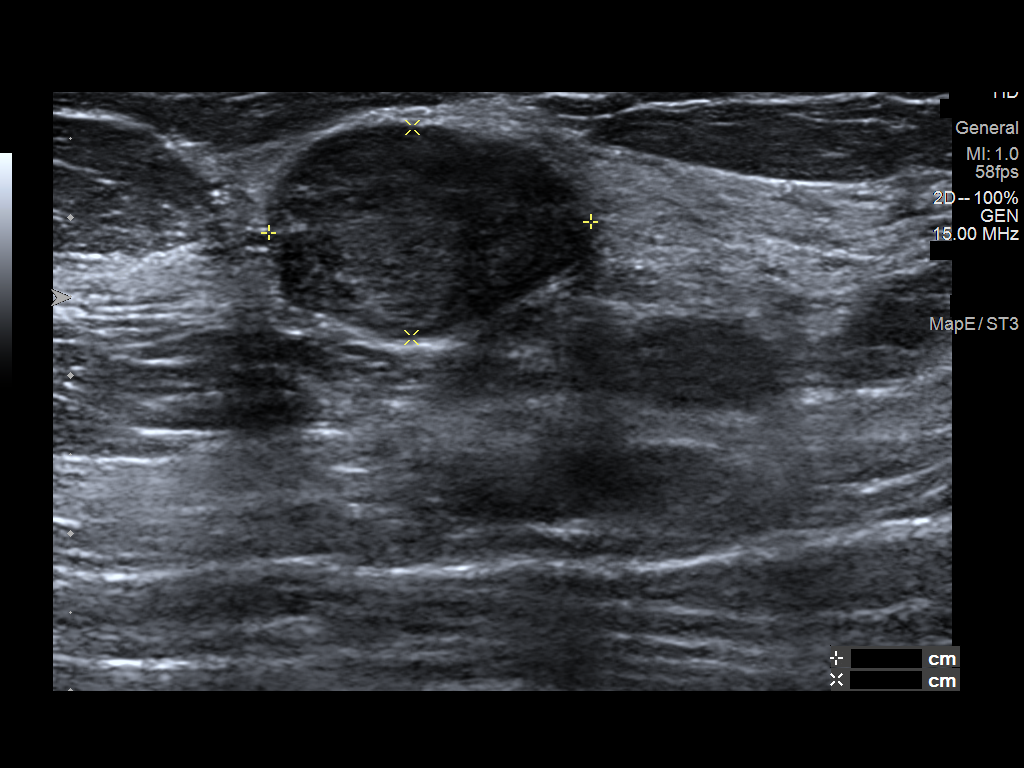
[im 4/4]
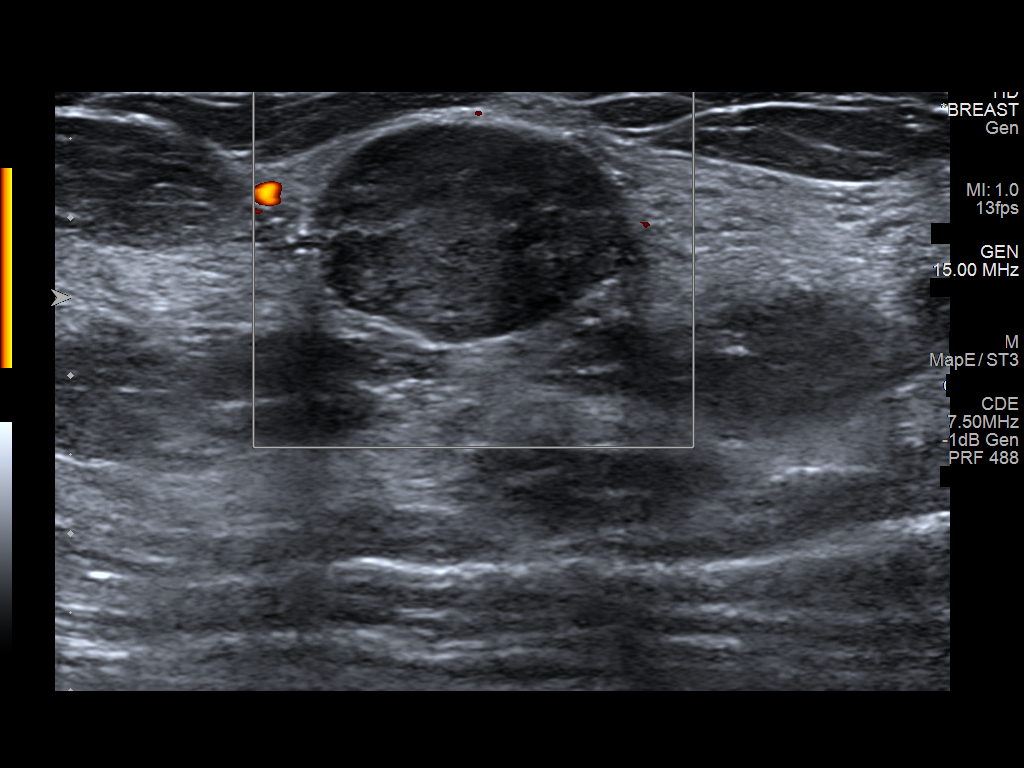

[4 of 4 positions shown; findings below may reference images not displayed]

FINDINGS: Targeted ultrasound is performed, showing a 2.6 x 2.0 x 1.3 cm oval
circumscribed hypoechoic right breast mass 10 o'clock position 5 cm
from nipple. This is unchanged compared to prior exams.
IMPRESSION: Right breast mass compatible with fibroadenoma given stability over
time.

RECOMMENDATION:
Continued clinical evaluation for right breast palpable area of
concern.

Screening mammogram at age 40 unless there are persistent or
intervening clinical concerns. (Code:FP-8-OCI)

I have discussed the findings and recommendations with the patient.
If applicable, a reminder letter will be sent to the patient
regarding the next appointment.

BI-RADS CATEGORY  2: Benign.

## 2022-04-26 ENCOUNTER — Telehealth: Payer: No Typology Code available for payment source | Admitting: Nurse Practitioner

## 2022-04-26 DIAGNOSIS — J40 Bronchitis, not specified as acute or chronic: Secondary | ICD-10-CM

## 2022-04-26 DIAGNOSIS — B3731 Acute candidiasis of vulva and vagina: Secondary | ICD-10-CM

## 2022-04-26 MED ORDER — AZITHROMYCIN 250 MG PO TABS: ORAL_TABLET | ORAL | 0 refills | Status: DC

## 2022-04-26 MED ORDER — PREDNISONE 20 MG PO TABS: 40.0000 mg | ORAL_TABLET | Freq: Every day | ORAL | 0 refills | Status: AC

## 2022-04-26 MED ORDER — BENZONATATE 100 MG PO CAPS: 100.0000 mg | ORAL_CAPSULE | Freq: Three times a day (TID) | ORAL | 0 refills | Status: DC | PRN

## 2022-04-26 NOTE — Progress Notes (Signed)
We are sorry that you are not feeling well.  Here is how we plan to help!  Based on your presentation I believe you most likely have A cough due to bacteria.  When patients have a fever and a productive cough with a change in color or increased sputum production, we are concerned about bacterial bronchitis.  If left untreated it can progress to pneumonia.  If your symptoms do not improve with your treatment plan it is important that you contact your provider.   I have prescribed Azithromyin 250 mg: two tablets now and then one tablet daily for 4 additonal days    In addition you may use A prescription cough medication called Tessalon Perles 100mg . You may take 1-2 capsules every 8 hours as needed for your cough.  Prednisone 20mg  2 tablets at same time daily for 5 days  From your responses in the eVisit questionnaire you describe inflammation in the upper respiratory tract which is causing a significant cough.  This is commonly called Bronchitis and has four common causes:   Allergies Viral Infections Acid Reflux Bacterial Infection Allergies, viruses and acid reflux are treated by controlling symptoms or eliminating the cause. An example might be a cough caused by taking certain blood pressure medications. You stop the cough by changing the medication. Another example might be a cough caused by acid reflux. Controlling the reflux helps control the cough.  USE OF BRONCHODILATOR ("RESCUE") INHALERS: There is a risk from using your bronchodilator too frequently.  The risk is that over-reliance on a medication which only relaxes the muscles surrounding the breathing tubes can reduce the effectiveness of medications prescribed to reduce swelling and congestion of the tubes themselves.  Although you feel brief relief from the bronchodilator inhaler, your asthma may actually be worsening with the tubes becoming more swollen and filled with mucus.  This can delay other crucial treatments, such as oral  steroid medications. If you need to use a bronchodilator inhaler daily, several times per day, you should discuss this with your provider.  There are probably better treatments that could be used to keep your asthma under control.     HOME CARE Only take medications as instructed by your medical team. Complete the entire course of an antibiotic. Drink plenty of fluids and get plenty of rest. Avoid close contacts especially the very young and the elderly Cover your mouth if you cough or cough into your sleeve. Always remember to wash your hands A steam or ultrasonic humidifier can help congestion.   GET HELP RIGHT AWAY IF: You develop worsening fever. You become short of breath You cough up blood. Your symptoms persist after you have completed your treatment plan MAKE SURE YOU  Understand these instructions. Will watch your condition. Will get help right away if you are not doing well or get worse.    Thank you for choosing an e-visit.  Your e-visit answers were reviewed by a board certified advanced clinical practitioner to complete your personal care plan. Depending upon the condition, your plan could have included both over the counter or prescription medications.  Please review your pharmacy choice. Make sure the pharmacy is open so you can pick up prescription now. If there is a problem, you may contact your provider through [employer] and have the prescription routed to another pharmacy.  Your safety is important to [place]. If you have drug allergies check your prescription carefully.   For the next 24 hours you can use MyChart to ask  questions about today's visit, request a non-urgent call back, or ask for a work or school excuse. You will get an email in the next two days asking about your experience. I hope that your e-visit has been valuable and will speed your recovery.  Mary-Margaret Hassell Done, FNP   5-10 minutes spent reviewing and documenting in chart.

## 2022-04-27 MED ORDER — FLUCONAZOLE 150 MG PO TABS: 150.0000 mg | ORAL_TABLET | Freq: Once | ORAL | 0 refills | Status: AC

## 2022-04-27 NOTE — Addendum Note (Signed)
Addended by: Dellia Nims on: 04/27/2022 07:14 AM   Modules accepted: Orders

## 2022-06-27 ENCOUNTER — Other Ambulatory Visit: Payer: Self-pay

## 2022-08-25 ENCOUNTER — Telehealth: Payer: Self-pay | Admitting: Family Medicine

## 2022-08-25 DIAGNOSIS — R21 Rash and other nonspecific skin eruption: Secondary | ICD-10-CM

## 2022-08-25 MED ORDER — TRIAMCINOLONE ACETONIDE 0.1 % EX CREA: 1.0000 | TOPICAL_CREAM | Freq: Two times a day (BID) | CUTANEOUS | 0 refills | Status: AC

## 2022-08-25 NOTE — Progress Notes (Signed)
E Visit for Rash  We are sorry that you are not feeling well. Here is how we plan to help!  Based on what you shared with me it looks like you have contact dermatitis.  Contact dermatitis is a skin rash caused by something that touches the skin and causes irritation or inflammation.  Your skin may be red, swollen, dry, cracked, and itch.  The rash should go away in a few days but can last a few weeks.  If you get a rash, it's important to figure out what caused it so the irritant can be avoided in the future.  Start triamcinolone   HOME CARE:  Take cool showers and avoid direct sunlight. Apply cool compress or wet dressings. Take a bath in an oatmeal bath.  Sprinkle content of one Aveeno packet under running faucet with comfortably warm water.  Bathe for 15-20 minutes, 1-2 times daily.  Pat dry with a towel. Do not rub the rash. Use hydrocortisone cream. Take an antihistamine like Benadryl for widespread rashes that itch.  The adult dose of Benadryl is 25-50 mg by mouth 4 times daily. Caution:  This type of medication may cause sleepiness.  Do not drink alcohol, drive, or operate dangerous machinery while taking antihistamines.  Do not take these medications if you have prostate enlargement.  Read package instructions thoroughly on all medications that you take.  GET HELP RIGHT AWAY IF:  Symptoms don't go away after treatment. Severe itching that persists. If you rash spreads or swells. If you rash begins to smell. If it blisters and opens or develops a yellow-brown crust. You develop a fever. You have a sore throat. You become short of breath.  MAKE SURE YOU:  Understand these instructions. Will watch your condition. Will get help right away if you are not doing well or get worse.  Thank you for choosing an e-visit.  Your e-visit answers were reviewed by a board certified advanced clinical practitioner to complete your personal care plan. Depending upon the condition, your plan  could have included both over the counter or prescription medications.  Please review your pharmacy choice. Make sure the pharmacy is open so you can pick up prescription now. If there is a problem, you may contact your provider through [employer] and have the prescription routed to another pharmacy.  Your safety is important to [place]. If you have drug allergies check your prescription carefully.   For the next 24 hours you can use MyChart to ask questions about today's visit, request a non-urgent call back, or ask for a work or school excuse. You will get an email in the next two days asking about your experience. I hope that your e-visit has been valuable and will speed your recovery.    have provided 5 minutes of non face to face time during this encounter for chart review and documentation.

## 2023-04-05 ENCOUNTER — Telehealth: Payer: Self-pay | Admitting: Family Medicine

## 2023-04-05 ENCOUNTER — Other Ambulatory Visit (HOSPITAL_COMMUNITY): Payer: Self-pay

## 2023-04-05 DIAGNOSIS — R062 Wheezing: Secondary | ICD-10-CM

## 2023-04-05 MED ORDER — ALBUTEROL SULFATE HFA 108 (90 BASE) MCG/ACT IN AERS
2.0000 | INHALATION_SPRAY | Freq: Four times a day (QID) | RESPIRATORY_TRACT | 0 refills | Status: AC | PRN
  Filled 2023-04-05: qty 6.7, 20d supply, fill #0

## 2023-04-05 NOTE — Progress Notes (Signed)
E-Visit for Asthma  Based on what you have shared with me, it looks like you may have a flare up of your asthma.  Asthma is a chronic (ongoing) lung disease which results in airway obstruction, inflammation and hyper-responsiveness.   Asthma symptoms vary from person to person, with common symptoms including nighttime awakening and decreased ability to participate in normal activities as a result of shortness of breath. It is often triggered by changes in weather, changes in the season, changes in air temperature, or inside (home, school, daycare or work) allergens such as animal dander, mold, mildew, woodstoves or cockroaches.   It can also be triggered by hormonal changes, extreme emotion, physical exertion or an upper respiratory tract illness.     It is important to identify the trigger, and then eliminate or avoid the trigger if possible.   If you have been prescribed medications to be taken on a regular basis, it is important to follow the asthma action plan and to follow guidelines to adjust medication in response to increasing symptoms of decreased peak expiratory flow rate  Treatment: I have prescribed: Albuterol (Proventil HFA; Ventolin HFA) 108 (90 Base) MCG/ACT Inhaler 2 puffs into the lungs every six hours as needed for wheezing or shortness of breath  HOME CARE Only take medications as instructed by your medical team. Consider wearing a mask or scarf to improve breathing air temperature have been shown to decrease irritation and decrease exacerbations Get rest. Taking a steamy shower or using a humidifier may help nasal congestion sand ease sore throat pain. You can place a towel over your head and breathe in the steam from hot water coming from a faucet. Using a saline nasal spray works much the same way.  Cough drops, hare candies and sore throat lozenges may ease your  cough.  Avoid close contacts especially the very you and the elderly Cover your mouth if you cough or sneeze Always remember to wash your hands.    GET HELP RIGHT AWAY IF: You develop worsening symptoms; breathlessness at rest, drowsy, confused or agitated, unable to speak in full sentences You have coughing fits You develop a severe headache or visual changes You develop shortness of breath, difficulty breathing or start having chest pain Your symptoms persist after you have completed your treatment plan If your symptoms do not improve within 10 days  MAKE SURE YOU Understand these instructions. Will watch your condition. Will get help right away if you are not doing well or get worse.   Your e-visit answers were reviewed by a board certified advanced clinical practitioner to complete your personal care plan, Depending upon the condition, your plan could have included both over the counter or prescription medications.   Please review your pharmacy choice. Your safety is important to us. If you have drug allergies check your prescription carefully.  You can use MyChart to ask questions about today's visit, request a non-urgent  call back, or ask for a work or school excuse for 24 hours related to this e-Visit. If it has been greater than 24 hours you will need to follow up with your provider, or enter a new e-Visit to address those concerns.   You will get an e-mail in the next two days asking about your experience. I hope that your e-visit has been valuable and will speed your recovery. Thank you for using e-visits.    have provided 5 minutes of non face to face time during this encounter for chart review and documentation.   

## 2023-04-12 ENCOUNTER — Telehealth: Payer: Self-pay | Admitting: Nurse Practitioner

## 2023-04-12 DIAGNOSIS — R197 Diarrhea, unspecified: Secondary | ICD-10-CM

## 2023-04-12 NOTE — Progress Notes (Signed)
E-Visit for Diarrhea  We are sorry that you are not feeling well.  Here is how we plan to help!  Based on what you have shared with me it looks like you have Acute Infectious Diarrhea.  Most cases of acute diarrhea are due to infections with virus and bacteria and are self-limited conditions lasting less than 14 days.  For your symptoms you may take Imodium 2 mg tablets that are over the counter at your local pharmacy. Take two tablet now and then one after each loose stool up to 6 a day.  Antibiotics are not needed for most people with diarrhea.   HOME CARE We recommend changing your diet to help with your symptoms for the next few days. Drink plenty of fluids that contain water salt and sugar. Sports drinks such as Gatorade may help.  You may try broths, soups, bananas, applesauce, soft breads, mashed potatoes or crackers.  You are considered infectious for as long as the diarrhea continues. Hand washing or use of alcohol based hand sanitizers is recommend. It is best to stay out of work or school until your symptoms stop.   GET HELP RIGHT AWAY If you have dark yellow colored urine or do not pass urine frequently you should drink more fluids.   If your symptoms worsen  If you feel like you are going to pass out (faint) You have a new problem  MAKE SURE YOU  Understand these instructions. Will watch your condition. Will get help right away if you are not doing well or get worse.  Thank you for choosing an e-visit.  Your e-visit answers were reviewed by a board certified advanced clinical practitioner to complete your personal care plan. Depending upon the condition, your plan could have included both over the counter or prescription medications.  Please review your pharmacy choice. Make sure the pharmacy is open so you can pick up prescription now. If there is a problem, you may contact your provider through MyChart messaging and have the prescription routed to another pharmacy.   Your safety is important to us. If you have drug allergies check your prescription carefully.   For the next 24 hours you can use MyChart to ask questions about today's visit, request a non-urgent call back, or ask for a work or school excuse. You will get an email in the next two days asking about your experience. I hope that your e-visit has been valuable and will speed your recovery.   I spent approximately 5 minutes reviewing the patient's history, current symptoms and coordinating their care today.   

## 2023-06-15 ENCOUNTER — Telehealth: Payer: Self-pay | Admitting: Nurse Practitioner

## 2023-06-15 DIAGNOSIS — J019 Acute sinusitis, unspecified: Secondary | ICD-10-CM

## 2023-06-15 DIAGNOSIS — B9789 Other viral agents as the cause of diseases classified elsewhere: Secondary | ICD-10-CM

## 2023-06-15 MED ORDER — LORATADINE 10 MG PO TABS
10.0000 mg | ORAL_TABLET | Freq: Every day | ORAL | 0 refills | Status: AC
Start: 2023-06-15 — End: ?

## 2023-06-15 NOTE — Progress Notes (Signed)
I have spent 5 minutes in review of e-visit questionnaire, review and updating patient chart, medical decision making and response to patient.  ° °Jerrell Mangel W Secilia Apps, NP ° °  °

## 2023-06-15 NOTE — Progress Notes (Signed)
E-Visit for Sinus Problems  We are sorry that you are not feeling well.  Here is how we plan to help!  Based on what you have shared with me it looks like you have sinusitis.  Sinusitis is inflammation and infection in the sinus cavities of the head.  Based on your presentation I believe you most likely have Acute Viral Sinusitis.This is an infection most likely caused by a virus. There is not specific treatment for viral sinusitis other than to help you with the symptoms until the infection runs its course.  You may use an oral decongestant such as Mucinex D or if you have glaucoma or high blood pressure use plain Mucinex. Saline nasal spray help and can safely be used as often as needed for congestion. I also recommend a nasal sinus rinse which could be purchased over the counter. At this time antibiotics would not be warranted. Please feel free to reach out to [place] or your pcp if symptoms have not resolved in 5-7 days  Some authorities believe that zinc sprays or the use of Echinacea may shorten the course of your symptoms.  Sinus infections are not as easily transmitted as other respiratory infection, however we still recommend that you avoid close contact with loved ones, especially the very young and elderly.  Remember to wash your hands thoroughly throughout the day as this is the number one way to prevent the spread of infection!  Home Care: Only take medications as instructed by your medical team. Do not take these medications with alcohol. A steam or ultrasonic humidifier can help congestion.  You can place a towel over your head and breathe in the steam from hot water coming from a faucet. Avoid close contacts especially the very young and the elderly. Cover your mouth when you cough or sneeze. Always remember to wash your hands.  Get Help Right Away If: You develop worsening fever or sinus pain. You develop a severe head ache or visual changes. Your symptoms persist after you have  completed your treatment plan.  Make sure you Understand these instructions. Will watch your condition. Will get help right away if you are not doing well or get worse.   Thank you for choosing an e-visit.  Your e-visit answers were reviewed by a board certified advanced clinical practitioner to complete your personal care plan. Depending upon the condition, your plan could have included both over the counter or prescription medications.  Please review your pharmacy choice. Make sure the pharmacy is open so you can pick up prescription now. If there is a problem, you may contact your provider through [employer] and have the prescription routed to another pharmacy.  Your safety is important to [place]. If you have drug allergies check your prescription carefully.   For the next 24 hours you can use MyChart to ask questions about today's visit, request a non-urgent call back, or ask for a work or school excuse. You will get an email in the next two days asking about your experience. I hope that your e-visit has been valuable and will speed your recovery.

## 2023-08-02 ENCOUNTER — Other Ambulatory Visit (HOSPITAL_COMMUNITY): Payer: Self-pay

## 2023-08-02 ENCOUNTER — Telehealth: Payer: 59 | Admitting: Emergency Medicine

## 2023-08-02 DIAGNOSIS — J019 Acute sinusitis, unspecified: Secondary | ICD-10-CM

## 2023-08-02 DIAGNOSIS — B9689 Other specified bacterial agents as the cause of diseases classified elsewhere: Secondary | ICD-10-CM | POA: Diagnosis not present

## 2023-08-02 MED ORDER — AMOXICILLIN-POT CLAVULANATE 875-125 MG PO TABS
1.0000 | ORAL_TABLET | Freq: Two times a day (BID) | ORAL | 0 refills | Status: DC
  Filled 2023-08-02: qty 20, 10d supply, fill #0

## 2023-08-02 MED ORDER — FLUCONAZOLE 150 MG PO TABS
150.0000 mg | ORAL_TABLET | ORAL | 0 refills | Status: AC | PRN
  Filled 2023-08-02: qty 2, 6d supply, fill #0

## 2023-08-02 NOTE — Progress Notes (Signed)

## 2023-08-02 NOTE — Addendum Note (Signed)
Addended by: Margaretann Loveless on: 08/02/2023 12:49 PM   Modules accepted: Orders

## 2023-08-05 ENCOUNTER — Other Ambulatory Visit (HOSPITAL_COMMUNITY): Payer: Self-pay

## 2023-08-05 MED ORDER — DOXYCYCLINE HYCLATE 100 MG PO TABS
100.0000 mg | ORAL_TABLET | Freq: Two times a day (BID) | ORAL | 0 refills | Status: AC
  Filled 2023-08-05: qty 20, 10d supply, fill #0

## 2023-08-05 NOTE — Addendum Note (Signed)
Addended by: Margaretann Loveless on: 08/05/2023 08:19 AM   Modules accepted: Orders

## 2024-01-20 ENCOUNTER — Ambulatory Visit (INDEPENDENT_AMBULATORY_CARE_PROVIDER_SITE_OTHER): Admitting: Plastic Surgery

## 2024-01-20 ENCOUNTER — Encounter: Payer: Self-pay | Admitting: Plastic Surgery

## 2024-01-20 VITALS — BP 124/80 | HR 92 | Ht 62.0 in | Wt 228.0 lb

## 2024-01-20 DIAGNOSIS — M546 Pain in thoracic spine: Secondary | ICD-10-CM | POA: Diagnosis not present

## 2024-01-20 DIAGNOSIS — Z6841 Body Mass Index (BMI) 40.0 and over, adult: Secondary | ICD-10-CM

## 2024-01-20 DIAGNOSIS — M549 Dorsalgia, unspecified: Secondary | ICD-10-CM | POA: Insufficient documentation

## 2024-01-20 DIAGNOSIS — G8929 Other chronic pain: Secondary | ICD-10-CM

## 2024-01-20 DIAGNOSIS — N62 Hypertrophy of breast: Secondary | ICD-10-CM | POA: Insufficient documentation

## 2024-01-20 NOTE — Progress Notes (Signed)
 Patient ID: Martha Pollard, female    DOB: 04-Jun-1996, 28 y.o.   MRN: [id number]   Chief Complaint  Patient presents with   Consult   Breast Problem    Mammary Hyperplasia: The patient is a 28 y.o. female with a history of mammary hyperplasia for several years.  She has extremely large breasts causing symptoms that include the following: Back pain in the upper and lower back, including neck pain. She pulls or pins her bra straps to provide better lift and relief of the pressure and pain. She notices relief by holding her breast up manually.  Her shoulder straps cause grooves and pain and pressure that requires padding for relief. Pain medication is sometimes required with motrin and tylenol .  Activities that are hindered by enlarged breasts include: exercise and running.  She has tried supportive clothing as well as fitted bras without improvement.  Her breasts are extremely large and fairly symmetric.  She has hyperpigmentation of the inframammary area on both sides.  The sternal to nipple distance on the right is 31 cm and the left is 31 cm.  The IMF distance is 18 cm.  She is 5 feet 2 inches tall and weighs 228 pounds.  The BMI = 41.7 kg/m.  Preoperative bra size = 89F cup.  The estimated excess breast tissue to be removed at the time of surgery = 650-700 grams on the left and 650-700 grams on the right.  Mammogram history: none.  Family history of breast cancer:  none.  Tobacco use:  none.   The patient expresses the desire to pursue surgical intervention.  The patient has had her wisdom teeth and tonsils removed without difficulty.     Review of Systems  Constitutional:  Positive for activity change. Negative for appetite change.  Eyes: Negative.   Respiratory: Negative.    Cardiovascular: Negative.   Gastrointestinal: Negative.   Endocrine: Negative.   Genitourinary: Negative.   Musculoskeletal:  Positive for back pain and neck pain.  Skin:  Positive for rash.    History  reviewed. No pertinent past medical history.  Past Surgical History:  Procedure Laterality Date   BREAST BIOPSY     right   TONSILLECTOMY AND ADENOIDECTOMY Bilateral 06/27/2020   Procedure: TONSILLECTOMY AND ADENOIDECTOMY;  Surgeon: Karis Clunes, MD;  Location: [facility];  Service: ENT;  Laterality: Bilateral;   WISDOM TOOTH EXTRACTION        Current Outpatient Medications:    albuterol  (VENTOLIN  HFA) 108 (90 Base) MCG/ACT inhaler, Inhale 2 puffs into the lungs every 6 (six) hours as needed for wheezing or shortness of breath., Disp: 6.7 g, Rfl: 0   fluconazole  (DIFLUCAN ) 150 MG tablet, Take 1 tablet (150 mg total) by mouth every 3 (three) days as needed., Disp: 2 tablet, Rfl: 0   loratadine  (CLARITIN ) 10 MG tablet, Take 1 tablet (10 mg total) by mouth daily., Disp: 30 tablet, Rfl: 0   triamcinolone  cream (KENALOG ) 0.1 %, Apply 1 Application topically 2 (two) times daily., Disp: 30 g, Rfl: 0   Objective:   Vitals:   01/20/24 1318  BP: 124/80  Pulse: 92  SpO2: 97%    Physical Exam Vitals reviewed.  Constitutional:      Appearance: Normal appearance.  HENT:     Head: Atraumatic.  Cardiovascular:     Rate and Rhythm: Normal rate.     Pulses: Normal pulses.  Pulmonary:     Effort: Pulmonary effort is normal.  Abdominal:  Palpations: Abdomen is soft.  Skin:    General: Skin is warm.     Capillary Refill: Capillary refill takes less than 2 seconds.  Neurological:     Mental Status: She is alert and oriented to person, place, and time.  Psychiatric:        Mood and Affect: Mood normal.        Thought Content: Thought content normal.        Judgment: Judgment normal.     Assessment & Plan:  Hypertrophy of breast - Plan: Ambulatory referral to Physical Therapy  Symptomatic mammary hypertrophy  Chronic bilateral thoracic back pain  The procedure the patient selected and that was best for the patient was discussed. The risk were discussed and include but  not limited to the following:  Breast asymmetry, fluid accumulation, firmness of the breast, inability to breast feed, loss of nipple or areola, skin loss, change in skin and nipple sensation, fat necrosis of the breast tissue, bleeding, infection and healing delay.  There are risks of anesthesia and injury to nerves or blood vessels.  Allergic reaction to tape, suture and skin glue are possible.  There will be swelling.  Any of these can lead to the need for revisional surgery which is not included in this surgery.  A breast reduction has potential to interfere with diagnostic procedures in the future.  This procedure is best done when the breast is fully developed.  Changes in the breast will continue to occur over time: pregnancy, weight gain or weigh loss. No guarantees are given for a certain bra or breast size.    Total time: 40 minutes. This includes time spent with the patient during the visit as well as time spent before and after the visit reviewing the chart, documenting the encounter, ordering pertinent studies and literature for the patient.   Physical therapy: Ordered Mammogram:  none  The patient is a good candidate for bilateral breast reduction with liposuction.  She is going to go through physical therapy and then come back and see us  in about 7 weeks.  If she still has the pain then we will submit for insurance preauthorization.  Pictures were obtained of the patient and placed in the chart with the patient's or guardian's permission.   Estefana RAMAN Darla Mcdonald, DO

## 2024-01-30 ENCOUNTER — Ambulatory Visit (INDEPENDENT_AMBULATORY_CARE_PROVIDER_SITE_OTHER): Admitting: Podiatry

## 2024-01-30 ENCOUNTER — Encounter: Payer: Self-pay | Admitting: Podiatry

## 2024-01-30 ENCOUNTER — Ambulatory Visit: Admitting: Physical Therapy

## 2024-01-30 ENCOUNTER — Ambulatory Visit (INDEPENDENT_AMBULATORY_CARE_PROVIDER_SITE_OTHER)

## 2024-01-30 ENCOUNTER — Ambulatory Visit: Admitting: Podiatry

## 2024-01-30 DIAGNOSIS — M7751 Other enthesopathy of right foot: Secondary | ICD-10-CM | POA: Diagnosis not present

## 2024-01-30 DIAGNOSIS — M778 Other enthesopathies, not elsewhere classified: Secondary | ICD-10-CM | POA: Diagnosis not present

## 2024-01-30 DIAGNOSIS — Q666 Other congenital valgus deformities of feet: Secondary | ICD-10-CM

## 2024-01-30 MED ORDER — METHYLPREDNISOLONE 4 MG PO TBPK: ORAL_TABLET | ORAL | 0 refills | Status: DC

## 2024-02-02 NOTE — Progress Notes (Signed)
 Subjective:  Patient ID: Martha Pollard, female    DOB: Oct 02, 1995,  MRN: [id number]  Chief Complaint  Patient presents with   Foot Pain    Rm14 Patient complains of pain dorsal right foot for 2 weeks/feels a lot of burning and stiff/hurts to walk and put pressure.    Discussed the use of AI scribe software for clinical note transcription with the patient, who gave verbal consent to proceed.  History of Present Illness Martha Pollard is a 28 year old female who presents with right foot pain.  She experiences intermittent pain on the dorsum of her right foot for the past few weeks, primarily after work and occasionally in the morning. On severe days, the pain persists throughout the day. There is no specific injury to the foot. Occasional pain occurs in the same area on her left foot, but it is less severe. She is unsure about swelling, noting possible mild puffiness.  Home remedies such as rolling her foot on a can have not provided significant relief. The pain does not significantly hinder her ability to work or perform activities, although she continues despite discomfort.  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 She previously worked as a Associate Professor, also involving prolonged standing.  She has an allergy to ibuprofen and has been advised to avoid anti-inflammatories like Aleve  and Motrin. She can take prednisone , which has been prescribed in the past. She reports soreness and a 'weird' feeling in the right foot.      Objective:    Physical Exam General: AAO x3, NAD  Dermatological: Skin is warm, dry and supple bilateral.  There are no open sores, no preulcerative lesions, no rash or signs of infection present.  Vascular: Dorsalis Pedis artery and Posterior Tibial artery pedal pulses are 2/4 bilateral with immedate capillary fill time. There is no pain with  calf compression, swelling, warmth, erythema.   Neruologic: Grossly intact via light touch bilateral.   Musculoskeletal: Tenderness to palpation noted along the second, third, fourth metatarsals directly.  There is localized edema but there is no erythema.  Flexor, extensor tendons intact.  Ankle range of motion intact.  MMT 5/5.  Flatfoot present.  Gait: Unassisted, Nonantalgic.         Results     Assessment:   1. Capsulitis of right foot   2. Pes planovalgus      Plan:  Patient was evaluated and treated and all questions answered.  Assessment and Plan Assessment & Plan Foot pain - X-rays obtained reviewed.  3 views obtained.  There is no definitive evidence of acute fracture today.  Joint space maintained. - Prescribe Medrol  Dosepak. - Advise icing the foot. - Recommend immobilizing the foot   Flat foot Flat foot structure exacerbating foot pain and strain due to lack of arch support. - Recommend shoes with better arch support. - Suggest brands: [employer], [employer], [place].  Recommend going to [employer] [facility] to be measured for shoes. - Consider adding shoe inserts. - Advise against flat sandals or flip-flops.  Risk of stress fracture Tenderness on metatarsal bones indicates risk of stress fracture from ongoing inflammation and strain. - Use a boot to immobilize the foot. - Plan follow-up in three to four weeks.    Return in about 4 weeks (around 02/27/2024) for right foot pain, repeat x-ray if still having pain.   Donnice JONELLE Fees DPM

## 2024-02-27 ENCOUNTER — Ambulatory Visit: Admitting: Podiatry

## 2024-03-13 ENCOUNTER — Institutional Professional Consult (permissible substitution): Admitting: Plastic Surgery

## 2024-03-23 ENCOUNTER — Ambulatory Visit: Admitting: Student

## 2024-04-02 ENCOUNTER — Ambulatory Visit (INDEPENDENT_AMBULATORY_CARE_PROVIDER_SITE_OTHER): Admitting: Podiatry

## 2024-04-02 ENCOUNTER — Ambulatory Visit

## 2024-04-02 ENCOUNTER — Encounter: Payer: Self-pay | Admitting: Podiatry

## 2024-04-02 ENCOUNTER — Ambulatory Visit (INDEPENDENT_AMBULATORY_CARE_PROVIDER_SITE_OTHER)

## 2024-04-02 VITALS — Ht 62.0 in | Wt 228.0 lb

## 2024-04-02 DIAGNOSIS — M778 Other enthesopathies, not elsewhere classified: Secondary | ICD-10-CM

## 2024-04-02 DIAGNOSIS — Q666 Other congenital valgus deformities of feet: Secondary | ICD-10-CM

## 2024-04-02 NOTE — Patient Instructions (Signed)

## 2024-04-05 NOTE — Progress Notes (Signed)
  Subjective:  Patient ID: Martha Pollard, female    DOB: 1996-01-17,  MRN: [id number]  Chief Complaint  Patient presents with   Foot Pain    Rm 11 Patient is here to f/u on right foot pain.  Pt states unable to wear boot at work, little improvement in pain.     History of Present Illness Martha Pollard is a 28 year old female who presents with right foot pain.  She states that she was not able to wear the cam boot while working.  She states that most of her pain is after being on her feet all day.  She gets new shoes which does seem to help some.  No recent injuries or changes otherwise.     Objective:    Physical Exam General: AAO x3, NAD  Dermatological: Skin is warm, dry and supple bilateral.  There are no open sores, no preulcerative lesions, no rash or signs of infection present.  Vascular: Dorsalis Pedis artery and Posterior Tibial artery pedal pulses are 2/4 bilateral with immedate capillary fill time. There is no pain with calf compression, swelling, warmth, erythema.   Neruologic: Grossly intact via light touch bilateral.   Musculoskeletal: Today not able to appreciate any areas of tenderness to palpation most notably along the second, third, fourth metatarsals directly.  There is localized edema but there is no erythema.  Flexor, extensor tendons intact.  Ankle range of motion intact.  MMT 5/5.  Flatfoot present.  Gait: Unassisted, Nonantalgic.    Assessment:   1. Capsulitis of right foot   2. Pes planovalgus      Plan:  Patient was evaluated and treated and all questions answered.  Assessment and Plan Assessment & Plan Foot pain - X-rays obtained reviewed.  3 views obtained.  There is no definitive evidence of acute fracture today.  Joint space maintained. - This time I discussed with her concerns that I do think some of her discomfort is coming from her more flatter foot structure.  Dispensed power step inserts.  Continue with supportive shoe gear.   If symptoms were to continue or worsen consider advanced imaging and/or custom orthotics.  Return if symptoms worsen or fail to improve.  Donnice JONELLE Fees DPM

## 2024-07-06 ENCOUNTER — Other Ambulatory Visit: Payer: Self-pay | Admitting: Podiatry

## 2024-07-06 ENCOUNTER — Encounter: Payer: Self-pay | Admitting: Podiatry

## 2024-07-06 NOTE — Progress Notes (Signed)
"  error  "

## 2024-07-07 ENCOUNTER — Other Ambulatory Visit: Payer: Self-pay | Admitting: Podiatry

## 2024-07-07 MED ORDER — METHYLPREDNISOLONE 4 MG PO TBPK: ORAL_TABLET | ORAL | 0 refills | Status: AC

## 2024-07-21 ENCOUNTER — Ambulatory Visit: Payer: Self-pay | Admitting: Podiatry

## 2024-07-21 DIAGNOSIS — M7751 Other enthesopathy of right foot: Secondary | ICD-10-CM | POA: Diagnosis not present

## 2024-07-21 DIAGNOSIS — Q666 Other congenital valgus deformities of feet: Secondary | ICD-10-CM

## 2024-07-21 DIAGNOSIS — M778 Other enthesopathies, not elsewhere classified: Secondary | ICD-10-CM

## 2024-07-21 MED ORDER — JOURNAVX 50 MG PO TABS: 1.0000 | ORAL_TABLET | Freq: Two times a day (BID) | ORAL | 0 refills | Status: AC

## 2024-07-21 NOTE — Patient Instructions (Signed)
 Suzetrigine  Tablets What is this medication? SUZETRIGINE  (soo ZE tri jeen) treats short-term moderate to severe pain. It works by blocking your nerves from sending pain signals to your brain. This medicine may be used for other purposes; ask your health care provider or pharmacist if you have questions. COMMON BRAND NAME(S): JOURNAVX  What should I tell my care team before I take this medication? They need to know if you have any of these conditions: Liver disease Kidney disease An unusual or allergic reaction to suzetrigine , other medications, foods, dyes, or preservatives Pregnant or trying to get pregnant Breastfeeding How should I use this medication? Take this medication by mouth with water. Take it as directed on the prescription label at the same time every day. Do not cut, crush, or chew this medication. Swallow the tablets whole. Take the first dose on an empty stomach, at least 1 hour before or 2 hours after food. After the first dose, you can take it with or without food. If it upsets your stomach, take it with food. Keep taking it unless your care team tells you to stop. Do not take this medication with foods or drinks containing grapefruit. Talk to your care team about the use of this medication in children. Special care may be needed. Overdosage: If you think you have taken too much of this medicine contact a poison control center or emergency room at once. NOTE: This medicine is only for you. Do not share this medicine with others. What if I miss a dose? It is important not to miss your dose. Talk to your care team about what to do if you miss a dose. What may interact with this medication? Do not take this medication with any of the following: Certain medications for cancer, such as adagrasib, ceritinib, idelalisib, tucatinib, ribociclib Certain medications for fungal infections, such as itraconazole, ketoconazole, posaconazole, voriconazole Certain medication for HIV or  hepatitis, such as atazanavir, cobicistat, darunavir, delavirdine, indinavir, nelfinavir, saquinavir, tipranavir, ritonavir Chloramphenicol Clarithromycin Lonafarnib Mifepristone Nefazodone This medication may also interact with the following: Certain progestin hormones St. John's wort This medication may affect how other medications work, and other medications may affect the way this medication works. Talk with your care team about all the medications you take. They may suggest changes to your treatment plan to lower the risk of side effects and to make sure your medications work as intended. This list may not describe all possible interactions. Give your health care provider a list of all the medicines, herbs, non-prescription drugs, or dietary supplements you use. Also tell them if you smoke, drink alcohol, or use illegal drugs. Some items may interact with your medicine. What should I watch for while using this medication? Visit your care team for regular checks on your progress. Tell your care team if your symptoms do not start to get better or if they get worse. Some progestin hormones may not work as well while you are taking this medication. If you are taking these hormones to prevent pregnancy, you may need to use a different or backup method of contraception. Your care team can help you find the option that works for you. Talk to your care team about how long you should use different or backup methods. This medication may cause infertility. It is usually temporary. Talk to your care team if you are concerned about your fertility. What side effects may I notice from receiving this medication? Side effects that you should report to your care team as soon as  possible: Allergic reactions--skin rash, itching, hives, swelling of the face, lips, tongue, or throat Side effects that usually do not require medical attention (report these to your care team if they continue or are  bothersome): Itching Muscle spasms Skin rash This list may not describe all possible side effects. Call your doctor for medical advice about side effects. You may report side effects to FDA at 1-800-FDA-1088. Where should I keep my medication? Keep out of the reach of children and pets. Store at room temperature between 20 and 25 degrees C (68 and 77 degrees F). Get rid of any unused medication after the expiration date. To get rid of medications that are no longer needed or have expired: Take the medication to a take-back program. Check with your pharmacy or law enforcement to find a location. If you cannot return the medication, check the label or package insert to see if the medication should be thrown out in the garbage or flushed down the toilet. If you are not sure, ask your care team. If it is safe to put it in the trash, empty the medication out of the container. Mix it with cat litter, dirt, coffee grounds, or another unwanted substance. Seal the mixture in a bag or container. Put it in the trash. NOTE: This sheet is a summary. It may not cover all possible information. If you have questions about this medicine, talk to your doctor, pharmacist, or health care provider.  2025 [employer]/Gold Standard (2023-08-23 00:00:00)

## 2024-07-21 NOTE — Progress Notes (Addendum)
"  °  Subjective:  Patient ID: Martha Pollard, female    DOB: 06-18-1996,  MRN: [id number] Chief Complaint  Patient presents with   Foot Problem    Patient presents today fot a F/U on Capsulitis of right foot. Reports symptoms have worsen since last visit patient relates medication did relief some pain but it did not last.     History of Present Illness Martha Pollard is a 29 year old female who presents with right foot pain.  She said was on steroids it does help but when she stops the medication the pain starts to come back.  Right side is worse than the left.  No recent injuries or changes otherwise.     Objective:    Physical Exam General: AAO x3, NAD  Dermatological: Skin is warm, dry and supple bilateral.  There are no open sores, no preulcerative lesions, no rash or signs of infection present.  Vascular: Dorsalis Pedis artery and Posterior Tibial artery pedal pulses are 2/4 bilateral with immedate capillary fill time. There is no pain with calf compression, swelling, warmth, erythema.   Neruologic: Grossly intact via light touch bilateral.   Musculoskeletal: Today not able to appreciate any areas of tenderness to palpation most notably along the forefoot along the second, third, fourth metatarsals directly.  Mild discomfort along the subtalar joint, sinus tarsi.  There is localized edema but there is no erythema.  Flexor, extensor tendons intact.  Ankle range of motion intact.  MMT 5/5.  Flatfoot present.  Gait: Unassisted, Nonantalgic.    Assessment:   1. Capsulitis of right foot   2. Pes planovalgus      Plan:  Patient was evaluated and treated and all questions answered.  Assessment and Plan Assessment & Plan Foot pain - X-rays obtained reviewed.  3 views obtained.  There is no definitive evidence of acute fracture today.  Joint space maintained. -Given ongoing nature of her symptoms I will order an MRI to further evaluate. -Referral to physical therapy -I  do think she will benefit from custom orthotics.  Authorization to be placed for this. (Faxed 07/21/2024 at 4:45pm) -Prescribed  Journavx  for pain   Return in about 6 weeks (around 09/01/2024), or if symptoms worsen or fail to improve.  Donnice JONELLE Fees DPM "

## 2024-07-24 ENCOUNTER — Encounter: Payer: Self-pay | Admitting: Podiatry

## 2024-07-24 ENCOUNTER — Other Ambulatory Visit: Payer: Self-pay

## 2024-07-24 DIAGNOSIS — M778 Other enthesopathies, not elsewhere classified: Secondary | ICD-10-CM

## 2024-07-24 DIAGNOSIS — M7751 Other enthesopathy of right foot: Secondary | ICD-10-CM

## 2024-07-27 ENCOUNTER — Telehealth: Payer: Self-pay | Admitting: Podiatry

## 2024-07-27 ENCOUNTER — Encounter: Payer: Self-pay | Admitting: Podiatry

## 2024-07-27 NOTE — Telephone Encounter (Signed)
 Pt needs work note advising she has to work the [employer]. I will send to her via MyChart.

## 2024-07-29 ENCOUNTER — Ambulatory Visit: Payer: PRIVATE HEALTH INSURANCE | Admitting: Physical Therapy

## 2024-07-29 ENCOUNTER — Other Ambulatory Visit: Payer: Self-pay

## 2024-08-06 ENCOUNTER — Other Ambulatory Visit: Payer: Self-pay

## 2024-08-14 ENCOUNTER — Inpatient Hospital Stay: Admission: RE | Admit: 2024-08-14 | Payer: PRIVATE HEALTH INSURANCE

## 2024-08-14 ENCOUNTER — Other Ambulatory Visit: Payer: PRIVATE HEALTH INSURANCE

## 2024-08-14 DIAGNOSIS — M778 Other enthesopathies, not elsewhere classified: Secondary | ICD-10-CM

## 2024-08-14 DIAGNOSIS — M7751 Other enthesopathy of right foot: Secondary | ICD-10-CM

## 2024-09-01 ENCOUNTER — Ambulatory Visit: Admitting: Podiatry
